# Patient Record
Sex: Female | Born: 1945 | Race: White | Hispanic: No | Marital: Married | Smoking: Never smoker
Health system: Southern US, Community
[De-identification: ages and names within clinical notes are randomized; demographics above are authoritative.]

## PROBLEM LIST (undated history)

## (undated) DIAGNOSIS — E039 Hypothyroidism, unspecified: Secondary | ICD-10-CM

## (undated) DIAGNOSIS — D649 Anemia, unspecified: Secondary | ICD-10-CM

## (undated) DIAGNOSIS — M1990 Unspecified osteoarthritis, unspecified site: Secondary | ICD-10-CM

## (undated) DIAGNOSIS — T7840XA Allergy, unspecified, initial encounter: Secondary | ICD-10-CM

## (undated) DIAGNOSIS — K219 Gastro-esophageal reflux disease without esophagitis: Secondary | ICD-10-CM

## (undated) HISTORY — PX: BREAST SURGERY: SHX581

## (undated) HISTORY — PX: APPENDECTOMY: SHX54

## (undated) HISTORY — PX: OTHER SURGICAL HISTORY: SHX169

## (undated) HISTORY — PX: ABDOMINAL HYSTERECTOMY: SHX81

---

## 2001-04-03 ENCOUNTER — Other Ambulatory Visit: Admission: RE | Admit: 2001-04-03 | Discharge: 2001-04-03 | Payer: Self-pay | Admitting: Internal Medicine

## 2001-04-09 ENCOUNTER — Encounter: Payer: Self-pay | Admitting: Internal Medicine

## 2001-04-09 ENCOUNTER — Ambulatory Visit (HOSPITAL_COMMUNITY): Admission: RE | Admit: 2001-04-09 | Discharge: 2001-04-09 | Payer: Self-pay | Admitting: Internal Medicine

## 2003-07-14 ENCOUNTER — Ambulatory Visit (HOSPITAL_COMMUNITY): Admission: RE | Admit: 2003-07-14 | Discharge: 2003-07-14 | Payer: Self-pay | Admitting: Family Medicine

## 2003-07-14 ENCOUNTER — Encounter: Payer: Self-pay | Admitting: Family Medicine

## 2003-08-06 ENCOUNTER — Encounter: Payer: Self-pay | Admitting: *Deleted

## 2003-08-06 ENCOUNTER — Ambulatory Visit (HOSPITAL_COMMUNITY): Admission: RE | Admit: 2003-08-06 | Discharge: 2003-08-06 | Payer: Self-pay | Admitting: *Deleted

## 2009-05-18 ENCOUNTER — Ambulatory Visit (HOSPITAL_COMMUNITY): Admission: RE | Admit: 2009-05-18 | Discharge: 2009-05-18 | Payer: Self-pay | Admitting: Family Medicine

## 2010-05-10 ENCOUNTER — Ambulatory Visit (HOSPITAL_COMMUNITY)
Admission: RE | Admit: 2010-05-10 | Discharge: 2010-05-10 | Payer: Self-pay | Source: Home / Self Care | Admitting: Family Medicine

## 2010-09-25 ENCOUNTER — Ambulatory Visit (HOSPITAL_COMMUNITY)
Admission: RE | Admit: 2010-09-25 | Discharge: 2010-09-25 | Payer: Self-pay | Source: Home / Self Care | Attending: General Surgery | Admitting: General Surgery

## 2010-11-05 ENCOUNTER — Encounter: Payer: Self-pay | Admitting: Family Medicine

## 2010-12-26 LAB — SURGICAL PCR SCREEN: Staphylococcus aureus: NEGATIVE

## 2010-12-26 LAB — CBC
HCT: 41.8 % (ref 36.0–46.0)
Hemoglobin: 14.4 g/dL (ref 12.0–15.0)
MCHC: 34.4 g/dL (ref 30.0–36.0)
RDW: 13.1 % (ref 11.5–15.5)

## 2010-12-26 LAB — BASIC METABOLIC PANEL
GFR calc Af Amer: >60 mL/min (ref >60)
GFR calc non Af Amer: >60 mL/min (ref >60)
Potassium: 4.2 mEq/L (ref 3.5–5.1)
Sodium: 138 mEq/L (ref 135–145)

## 2011-03-02 NOTE — H&P (Signed)
NAME:  Danielle, Ritter                    ACCOUNT NO.:   MEDICAL RECORD NO.:  [id number]        PATIENT TYPE:  PAMB   LOCATION:  DAY                           FACILITY:  [facility]   PHYSICIAN:  Dalia Heading, M.D.       DATE OF BIRTH:   DATE OF ADMISSION:  DATE OF DISCHARGE:  LH                              HISTORY & PHYSICAL    CHIEF COMPLAINT:  Right nipple discharge.   HISTORY OF PRESENT ILLNESS:  The patient is a 65 year old white female  who is referred for evaluation and treatment of right nipple discharge.  It has been present for many months since this summer.  Mammograms have  been negative.  She had a failed attempt at galactography at the breast  center and at [facility].  Cytology reportedly was negative.  She was given  the option for surgery or 82-month followup.  The discharge has always  been clear.  She had a benign mass taken on her left breast at the age  of 73.   PAST MEDICAL HISTORY:  Reflux disease.   PAST SURGICAL HISTORY:  As noted above.   CURRENT MEDICATIONS:  Omeprazole, vitamin E, calcium supplements, B12  supplements, and glucosamine.   ALLERGIES:  CODEINE.   REVIEW OF SYSTEMS:  The patient denies drinking or smoking.  She denies  any other cardiopulmonary difficulties or bleeding disorders.   FAMILY MEDICAL HISTORY:  There is no family history of breast cancer.   PHYSICAL EXAMINATION:  GENERAL:  The patient is a well-developed, well-  nourished white female in no acute distress.  HEENT:  Unremarkable.  LUNGS:  Clear to auscultation with equal breath sounds bilaterally.  HEART:  Regular rate and rhythm without S3, S4, or murmurs.  BREAST:  Right breast examination reveals no dominant mass or dimpling.  There is a clear nipple discharge at the 3 o'clock position.  No  axillary lymphadenopathy is noted.  Left breast examination reveals no  dominant mass, nipple discharge, or dimpling.  The axilla is negative  for palpable nodes.   IMPRESSION:  Right  nipple discharge.   PLAN:  The patient is scheduled for right breast biopsy on September 27, 2010.  Risks and benefits of the procedure including bleeding,  infection, and the possibility of malignancy were fully explained to the  patient, gave informed consent.      Dalia Heading, M.D.      MAJ/MEDQ  D:  09/19/2010  T:  09/20/2010  Job:  [id number]   cc:   Corrie Mckusick, M.D.  Fax: [phone]   Short Stay  [facility] Hawking

## 2011-03-02 NOTE — Procedures (Signed)
   NAME:  RADA, ZEGERS NO.:  [id number]   MEDICAL RECORD NO.:  [id number]                   PATIENT TYPE:  OUT   LOCATION:  RAD                                  FACILITY:  [facility]   PHYSICIAN:  Vida Roller, M.D.                DATE OF BIRTH:  [birth year]   DATE OF PROCEDURE:  DATE OF DISCHARGE:                                    STRESS TEST   INDICATIONS:  Ms. Costilow is a 65 year old female with no known coronary  artery disease with atypical chest discomfort.  Her cardiac risk factors  include hyperlipidemia and positive family history.   BASELINE DATA:  EKG reveals sinus rhythm at 73 beats per minute with  incomplete right bundle branch block and nondiagnostic inferior Q-waves  noted.  Blood pressure is 128/72.   Patient exercised for a total of six minutes to Bruce protocol stage II and  7 mets.  Maximum heart rate achieved was 155 beats per minute, which is 96%  of predicted maximum.  Maximum blood pressure is 188/82, which resolved down  to 130/72 in recovery.  The patient complained of brief episode of chest  pain in stage I of exercise that lasted only one to two seconds.  She  reports this is consistent with the chest pain she has been having.  EKG  revealed a few PVCs and no ischemic changes.  Test was stopped secondary to  fatigue.  Cardiolite was injected at three minutes and 41 seconds into  exercise.   Final images and results are pending M.D. review.     ________________________________________  ___________________________________________  Jae Dire, P.A. [facility]                      Vida Roller, M.D.   AB/MEDQ  D:  08/06/2003  T:  08/06/2003  Job:  [id number]

## 2013-01-19 ENCOUNTER — Other Ambulatory Visit (HOSPITAL_COMMUNITY): Payer: Self-pay | Admitting: Family Medicine

## 2013-01-19 DIAGNOSIS — Z13820 Encounter for screening for osteoporosis: Secondary | ICD-10-CM

## 2013-01-20 ENCOUNTER — Ambulatory Visit (HOSPITAL_COMMUNITY)
Admission: RE | Admit: 2013-01-20 | Discharge: 2013-01-20 | Disposition: A | Discharge status: Home / Self Care | Payer: Medicare Other | Source: Ambulatory Visit | Attending: Family Medicine | Admitting: Family Medicine

## 2013-01-20 DIAGNOSIS — M899 Disorder of bone, unspecified: Secondary | ICD-10-CM | POA: Insufficient documentation

## 2013-01-20 DIAGNOSIS — Z13820 Encounter for screening for osteoporosis: Secondary | ICD-10-CM

## 2015-08-17 DIAGNOSIS — Z1389 Encounter for screening for other disorder: Secondary | ICD-10-CM | POA: Diagnosis not present

## 2015-08-17 DIAGNOSIS — Z6830 Body mass index (BMI) 30.0-30.9, adult: Secondary | ICD-10-CM | POA: Diagnosis not present

## 2015-08-17 DIAGNOSIS — Z0000 Encounter for general adult medical examination without abnormal findings: Secondary | ICD-10-CM | POA: Diagnosis not present

## 2015-08-17 DIAGNOSIS — R0602 Shortness of breath: Secondary | ICD-10-CM | POA: Diagnosis not present

## 2015-08-17 DIAGNOSIS — R7301 Impaired fasting glucose: Secondary | ICD-10-CM | POA: Diagnosis not present

## 2015-08-17 DIAGNOSIS — E6609 Other obesity due to excess calories: Secondary | ICD-10-CM | POA: Diagnosis not present

## 2015-12-20 ENCOUNTER — Ambulatory Visit: Payer: Self-pay

## 2016-04-27 ENCOUNTER — Other Ambulatory Visit (HOSPITAL_COMMUNITY): Payer: Self-pay | Admitting: Internal Medicine

## 2016-04-27 DIAGNOSIS — R109 Unspecified abdominal pain: Secondary | ICD-10-CM | POA: Diagnosis not present

## 2016-04-27 DIAGNOSIS — K219 Gastro-esophageal reflux disease without esophagitis: Secondary | ICD-10-CM | POA: Diagnosis not present

## 2016-04-27 DIAGNOSIS — Z1389 Encounter for screening for other disorder: Secondary | ICD-10-CM | POA: Diagnosis not present

## 2016-05-03 ENCOUNTER — Ambulatory Visit (HOSPITAL_COMMUNITY)
Admission: RE | Admit: 2016-05-03 | Discharge: 2016-05-03 | Disposition: A | Discharge status: Home / Self Care | Payer: Medicare Other | Source: Ambulatory Visit | Attending: Internal Medicine | Admitting: Internal Medicine

## 2016-05-03 ENCOUNTER — Other Ambulatory Visit (HOSPITAL_COMMUNITY): Payer: Self-pay | Admitting: Family Medicine

## 2016-05-03 DIAGNOSIS — R109 Unspecified abdominal pain: Secondary | ICD-10-CM | POA: Diagnosis not present

## 2016-05-17 ENCOUNTER — Ambulatory Visit (INDEPENDENT_AMBULATORY_CARE_PROVIDER_SITE_OTHER): Payer: Medicare Other | Admitting: Cardiovascular Disease

## 2016-05-17 ENCOUNTER — Encounter: Payer: Self-pay | Admitting: Cardiovascular Disease

## 2016-05-17 VITALS — BP 136/74 | HR 86 | Ht 61.0 in | Wt 150.0 lb

## 2016-05-17 DIAGNOSIS — R0602 Shortness of breath: Secondary | ICD-10-CM

## 2016-05-17 DIAGNOSIS — R079 Chest pain, unspecified: Secondary | ICD-10-CM | POA: Diagnosis not present

## 2016-05-17 DIAGNOSIS — Z136 Encounter for screening for cardiovascular disorders: Secondary | ICD-10-CM

## 2016-05-17 NOTE — Patient Instructions (Signed)
Medication Instructions:  Your physician recommends that you continue on your current medications as directed. Please refer to the Current Medication list given to you today.   Labwork: NONE  Testing/Procedures: Your physician has requested that you have an echocardiogram. Echocardiography is a painless test that uses sound waves to create images of your heart. It provides your doctor with information about the size and shape of your heart and how well your heart's chambers and valves are working. This procedure takes approximately one hour. There are no restrictions for this procedure.   Your physician has requested that you have an echocardiogram. Echocardiography is a painless test that uses sound waves to create images of your heart. It provides your doctor with information about the size and shape of your heart and how well your heart's chambers and valves are working. This procedure takes approximately one hour. There are no restrictions for this procedure.   Your physician has requested that you have a lexiscan myoview. For further information please visit [email or web address]. Please follow instruction sheet, as given.    Follow-Up: Your physician recommends that you schedule a follow-up appointment in: 1 MONTH    Any Other Special Instructions Will Be Listed Below (If Applicable).     If you need a refill on your cardiac medications before your next appointment, please call your pharmacy.

## 2016-05-17 NOTE — Progress Notes (Addendum)
CARDIOLOGY CONSULT NOTE  Patient ID: Danielle Ritter MRN: [id number] DOB/AGE: 1946-08-23 70 y.o.  Admit date: (Not on file) Primary Physician: Purvis Kilts, MD Referring Physician:   Reason for Consultation: chest pain  HPI: The patient is a 70 year old woman referred for the evaluation of chest pain. She has a history of GERD. She takes fish oil. A recent abdominal ultrasound showed a contracted gallbladder filled with gallstones. She says she has a retrosternal "dull ache" which is not necessarily exacerbated with exertion. She does have exertional dyspnea for which she takes Lasix which alleviates her symptoms. She also has bilateral swelling of the ankles and feet for the past 37 years. She denies orthopnea and paroxysmal nocturnal dyspnea. She has worn compression stockings throughout the year for several years.  She has been awoken by palpitations at night but this was some time back.  Family history: Sister died of congestive heart failure at age 7, another sister had CABG at age 68, brother had CABG at age 65.   Allergies  Allergen Reactions  . Omeprazole     Current Outpatient Prescriptions  Medication Sig Dispense Refill  . calcium carbonate (TUMS - DOSED IN MG ELEMENTAL CALCIUM) 500 MG chewable tablet Chew 1 tablet by mouth daily.    . furosemide (LASIX) 20 MG tablet Take 20 mg by mouth daily as needed.    Javier Docker Oil 300 MG CAPS Take 300 mg by mouth daily.    Marland Kitchen loratadine (CLARITIN) 10 MG tablet Take 10 mg by mouth daily.    Marland Kitchen omeprazole (PRILOSEC) 10 MG capsule Take 10 mg by mouth daily.    . vitamin E (VITAMIN E) 200 UNIT capsule Take 200 Units by mouth daily.     No current facility-administered medications for this visit.     History reviewed. No pertinent past medical history.  Past Surgical History:  Procedure Laterality Date  . APPENDECTOMY    . bladder tact    . hystorectomy      Social History   Social History  . Marital status:  Married    Spouse name: N/A  . Number of children: N/A  . Years of education: N/A   Occupational History  . Not on file.   Social History Main Topics  . Smoking status: Never Smoker  . Smokeless tobacco: Never Used  . Alcohol use No  . Drug use: No  . Sexual activity: Not Currently   Other Topics Concern  . Not on file   Social History Narrative  . No narrative on file      Prior to Admission medications   Not on File     Review of systems complete and found to be negative unless listed above in HPI     Physical exam Blood pressure 136/74, pulse 86, height 5\' 1"  (1.549 m), weight 150 lb (68 kg), SpO2 95 %. General: NAD Neck: No JVD, no thyromegaly or thyroid nodule.  Lungs: Clear to auscultation bilaterally with normal respiratory effort. CV: Nondisplaced PMI. Regular rate and rhythm, normal S1/S2, no S3/S4, no murmur.  No peripheral edema.  No carotid bruit.  Normal pedal pulses.  Abdomen: Soft, nontender, no distention.  Skin: Intact without lesions or rashes.  Neurologic: Alert and oriented x 3.  Psych: Normal affect. Extremities: No clubbing or cyanosis.  HEENT: Normal.   ECG: Most recent ECG reviewed.  Labs:   Lab Results  Component Value Date   WBC 6.5 09/22/2010  HGB 14.4 09/22/2010   HCT 41.8 09/22/2010   MCV 90.5 09/22/2010   PLT 198 09/22/2010   No results for input(s): NA, K, CL, CO2, BUN, CREATININE, CALCIUM, PROT, BILITOT, ALKPHOS, ALT, AST, GLUCOSE in the last 168 hours.  Invalid input(s): LABALBU No results found for: CKTOTAL, CKMB, CKMBINDEX, TROPONINI No results found for: CHOL No results found for: HDL No results found for: LDLCALC No results found for: TRIG No results found for: CHOLHDL No results found for: LDLDIRECT       Studies: No results found.  ASSESSMENT AND PLAN:  1. Chest pain and exertional dyspnea: Typical and atypical features of CAD. Has a strong family h/o premature CAD. I will obtain an ECG today.  I will  proceed with a nuclear myocardial perfusion imaging study to evaluate for ischemic heart disease. I will order a 2-D echocardiogram with Doppler to evaluate cardiac structure, function, and regional wall motion.   Dispo: fu 6 weeks  Signed: Kate Sable, M.D., F.A.C.C.  05/17/2016, 3:41 PM   ECG performed in the office today which I personally reviewed demonstrates normal sinus rhythm with no ischemic ST segment or T-wave abnormalities, nor any arrhythmias.

## 2016-05-22 ENCOUNTER — Encounter (INDEPENDENT_AMBULATORY_CARE_PROVIDER_SITE_OTHER): Payer: Self-pay | Admitting: Internal Medicine

## 2016-05-29 ENCOUNTER — Ambulatory Visit (HOSPITAL_COMMUNITY): Payer: Medicare Other

## 2016-05-29 ENCOUNTER — Encounter (HOSPITAL_COMMUNITY)
Admission: RE | Admit: 2016-05-29 | Discharge: 2016-05-29 | Disposition: A | Discharge status: Home / Self Care | Payer: Medicare Other | Source: Ambulatory Visit | Attending: Cardiovascular Disease | Admitting: Cardiovascular Disease

## 2016-05-29 ENCOUNTER — Inpatient Hospital Stay (HOSPITAL_COMMUNITY): Admission: RE | Admit: 2016-05-29 | Payer: Medicare Other | Source: Ambulatory Visit

## 2016-05-29 ENCOUNTER — Encounter (HOSPITAL_COMMUNITY): Payer: Self-pay

## 2016-05-29 DIAGNOSIS — R079 Chest pain, unspecified: Secondary | ICD-10-CM | POA: Diagnosis not present

## 2016-05-29 DIAGNOSIS — R0602 Shortness of breath: Secondary | ICD-10-CM | POA: Diagnosis not present

## 2016-05-29 LAB — NM MYOCAR MULTI W/SPECT W/WALL MOTION / EF
CHL CUP NUCLEAR SDS: 0
CHL CUP NUCLEAR SRS: 0
CHL CUP RESTING HR STRESS: 72 {beats}/min
LHR: 0.26
LV dias vol: 55 mL (ref 46–106)
LV sys vol: 23 mL
NUC STRESS TID: 1.01
Peak HR: 110 {beats}/min
SSS: 0

## 2016-05-29 MED ORDER — SODIUM CHLORIDE 0.9% FLUSH
INTRAVENOUS | Status: AC
  Administered 2016-05-29: 10 mL via INTRAVENOUS
  Filled 2016-05-29: qty 10

## 2016-05-29 MED ORDER — TECHNETIUM TC 99M TETROFOSMIN IV KIT
30.0000 | PACK | Freq: Once | INTRAVENOUS | Status: AC | PRN
  Administered 2016-05-29: 31 via INTRAVENOUS

## 2016-05-29 MED ORDER — REGADENOSON 0.4 MG/5ML IV SOLN
INTRAVENOUS | Status: AC
  Administered 2016-05-29: 0.4 mg via INTRAVENOUS
  Filled 2016-05-29: qty 5

## 2016-05-29 MED ORDER — TECHNETIUM TC 99M TETROFOSMIN IV KIT
10.0000 | PACK | Freq: Once | INTRAVENOUS | Status: AC | PRN
  Administered 2016-05-29: 10.7 via INTRAVENOUS

## 2016-06-05 ENCOUNTER — Ambulatory Visit (INDEPENDENT_AMBULATORY_CARE_PROVIDER_SITE_OTHER): Payer: Medicare Other | Admitting: Internal Medicine

## 2016-06-05 DIAGNOSIS — K8020 Calculus of gallbladder without cholecystitis without obstruction: Secondary | ICD-10-CM | POA: Diagnosis not present

## 2016-06-07 ENCOUNTER — Encounter (INDEPENDENT_AMBULATORY_CARE_PROVIDER_SITE_OTHER): Payer: Self-pay | Admitting: Internal Medicine

## 2016-06-07 ENCOUNTER — Other Ambulatory Visit (INDEPENDENT_AMBULATORY_CARE_PROVIDER_SITE_OTHER): Payer: Self-pay | Admitting: Internal Medicine

## 2016-06-07 ENCOUNTER — Encounter (INDEPENDENT_AMBULATORY_CARE_PROVIDER_SITE_OTHER): Payer: Self-pay | Admitting: *Deleted

## 2016-06-07 ENCOUNTER — Ambulatory Visit (INDEPENDENT_AMBULATORY_CARE_PROVIDER_SITE_OTHER): Payer: Medicare Other | Admitting: Internal Medicine

## 2016-06-07 VITALS — BP 124/70 | HR 64 | Temp 98.6°F | Ht 61.0 in | Wt 152.3 lb

## 2016-06-07 DIAGNOSIS — K8020 Calculus of gallbladder without cholecystitis without obstruction: Secondary | ICD-10-CM | POA: Insufficient documentation

## 2016-06-07 DIAGNOSIS — E7800 Pure hypercholesterolemia, unspecified: Secondary | ICD-10-CM | POA: Insufficient documentation

## 2016-06-07 DIAGNOSIS — K219 Gastro-esophageal reflux disease without esophagitis: Secondary | ICD-10-CM

## 2016-06-07 DIAGNOSIS — M25473 Effusion, unspecified ankle: Secondary | ICD-10-CM | POA: Insufficient documentation

## 2016-06-07 NOTE — Progress Notes (Signed)
   Subjective:    Patient ID: Danielle Ritter, female    DOB: 17-Jul-1946, 70 y.o.   MRN: [id number]  HPI Referred by Dr. Hilma Favors for GERD. She presents today with c/o pain rt side of her upper  back.  Sometimes she has pain under her rt ribs. She takes Omeprazole OTC for acid reflux. She takes one in am.  If she skips a dose she will have acid reflux. She has had acid reflux for years. She has been on Omeprazole for years.  She has been evaluated by Dr. Arnoldo Morale and he wants her to have an EGD before he proceeds with a cholecystectomy.   05/03/2016 US abdomen:  CLINICAL DATA:  Two days of right upper back and abdominal pain. History of previous abdominal hysterectomy and appendectomy  Other findings: No ascites.  IMPRESSION: 1. Probably contracted gallbladder filled with stones. This could be confirmed with abdominal CT scanning. 2. Limited visualization of the aorta, inferior vena cava, and pancreas due to bowel gas. Otherwise no abnormality outside of the gallbladder is observed.   Last colonoscopy in 2010: few small diverticula at sigmoid colon. Otherwise normal exam.    Review of Systems No past medical history on file.  Past Surgical History:  Procedure Laterality Date  . APPENDECTOMY    . bladder tact    . hystorectomy      No Known Allergies  Current Outpatient Prescriptions on File Prior to Visit  Medication Sig Dispense Refill  . calcium carbonate (TUMS - DOSED IN MG ELEMENTAL CALCIUM) 500 MG chewable tablet Chew 1 tablet by mouth daily.    . furosemide (LASIX) 20 MG tablet Take 20 mg by mouth daily as needed.    Javier Docker Oil 300 MG CAPS Take 300 mg by mouth daily.    Marland Kitchen loratadine (CLARITIN) 10 MG tablet Take 10 mg by mouth daily.    Marland Kitchen omeprazole (PRILOSEC) 10 MG capsule Take 10 mg by mouth daily.    . vitamin E (VITAMIN E) 200 UNIT capsule Take 400 Units by mouth daily.      No current facility-administered medications on file prior to visit.        Objective:    Physical Exam Blood pressure 124/70, pulse 64, temperature 98.6 F (37 C), height 5\' 1"  (1.549 m), weight 152 lb 4.8 oz (69.1 kg).  Alert and oriented. Skin warm and dry. Oral mucosa is moist.   . Sclera anicteric, conjunctivae is pink. Thyroid not enlarged. No cervical lymphadenopathy. Lungs clear. Heart regular rate and rhythm.  Abdomen is soft. Bowel sounds are positive. No hepatomegaly. No abdominal masses felt. No tenderness.  No edema to lower extremities.  .      Assessment & Plan:  GERD. Has been on Omeprazole for years. PUD needs to be ruled out. The risks and benefits such as perforation, bleeding, and infection were reviewed with the patient and is agreeable.

## 2016-06-07 NOTE — Patient Instructions (Signed)
The risks and benefits such as perforation, bleeding, and infection were reviewed with the patient and is agreeable. 

## 2016-06-08 NOTE — H&P (Signed)
  NTS SOAP Note  Vital Signs:  Vitals as of: XX123456: Systolic XX123456: Diastolic 84: Heart Rate 85: Temp 97.71F (Temporal): Height 71ft 1in: Weight 152Lbs 0 Ounces: BMI 28.72   BMI : 28.72 kg/m2  Subjective: This 70 year old female presents for of right upper quadrant abdominal pain.  Has been occurring sporadically for many months now.  Made worse with fatty foods, though tries to avoid them.  Develops pain in right upper quadrant, radiating to the right flank and shoulder.  Makes reflux worse.  +bloating.  No nausea, vomiting.  No fever, chills, jaundice.  Pain resolves spontaneously.  Currently feels ok.  Review of Symptoms:  Constitutional:negative Head:negative Eyes:negative sinus problems Cardiovascular:negative Respiratory:negative Gastrointestinheartburn Genitourinary:negative joint and back pain Skin:negative Hematolgic/Lymphatic:negative Allergic/Immunologic:negative   Past Medical History:Reviewed  Past Medical History  Surgical History: breast biopsy, [facility] Medical Problems: reflux Allergies: nkda Medications: prilosec, loratadine, krill oil   Social History:Reviewed  Social History  Preferred Language: English Race:  White Ethnicity: Not Hispanic / Latino Age: 31 year Marital Status:  M Alcohol: no   Smoking Status: Never smoker reviewed on 06/05/2016 Functional Status reviewed on 06/05/2016 ------------------------------------------------ Bathing: Normal Cooking: Normal Dressing: Normal Driving: Normal Eating: Normal Managing Meds: Normal Oral Care: Normal Shopping: Normal Toileting: Normal Transferring: Normal Walking: Normal Cognitive Status reviewed on 06/05/2016 ------------------------------------------------ Attention: Normal Decision Making: Normal Language: Normal Memory: Normal Motor: Normal Perception: Normal Problem Solving: Normal Visual and Spatial: Normal   Family History:Reviewed  Family Health  History Mother, Deceased; Heart failure;  Father, Deceased; Hodgkin's lymphoma;     Objective Information: General:Well appearing, well nourished in no distress. Skin:no rash or prominent lesions Head:Atraumatic; no masses; no abnormalities Neck:Supple without lymphadenopathy.  Heart:RRR, no murmur or gallop.  Normal S1, S2.  No S3, S4.  Lungs:CTA bilaterally, no wheezes, rhonchi, rales.  Breathing unlabored. Abdomen:Soft, NT/ND, normal bowel sounds, no HSM, no masses.  No peritoneal signs. U/S report reviewed.  Cholelithiasis, normal common bile duct Assessment:Cholelithiasis  Diagnoses: 574.20  K80.20 Cholelithiasis without obstruction (Calculus of gallbladder without cholecystitis without obstruction)  Procedures: [id number] - OFFICE OUTPATIENT NEW 30 MINUTES    Plan:  Patient will see Dr. Laural Golden later today for possible EGD.  Will call to schedule laparoscopic cholecystectomy.   Patient Education:Alternative treatments to surgery were discussed with patient (and family).Risks and benefits  of procedure including bleeding, infection, hepatobiliary injury, and the possibility of an open procedure were fully explained to the patient (and family) who gave informed consent. Patient/family questions were addressed.  Follow-up:Pending Surgery

## 2016-06-14 NOTE — Patient Instructions (Signed)
Danielle Ritter  06/14/2016     @PREFPERIOPPHARMACY @   Your procedure is scheduled on  06/20/2016  Report to [facility] at  70  A.M.  Call this number if you have problems the morning of surgery:  [phone]   Remember:  Do not eat food or drink liquids after midnight.  Take these medicines the morning of surgery with A SIP OF WATER  Claritin, prilosec.   Do not wear jewelry, make-up or nail polish.  Do not wear lotions, powders, or perfumes, or deoderant.  Do not shave 48 hours prior to surgery.  Men may shave face and neck.  Do not bring valuables to the hospital.  [facility] is not responsible for any belongings or valuables.  Contacts, dentures or bridgework may not be worn into surgery.  Leave your suitcase in the car.  After surgery it may be brought to your room.  For patients admitted to the hospital, discharge time will be determined by your treatment team.  Patients discharged the day of surgery will not be allowed to drive home.   Name and phone number of your driver:   family Special instructions:  none  Please read over the following fact sheets that you were given. Anesthesia Post-op Instructions and Care and Recovery After Surgery       Laparoscopic Cholecystectomy Laparoscopic cholecystectomy is surgery to remove the gallbladder. The gallbladder is located in the upper right part of the abdomen, behind the liver. It is a storage sac for bile, which is produced in the liver. Bile aids in the digestion and absorption of fats. Cholecystectomy is often done for inflammation of the gallbladder (cholecystitis). This condition is usually caused by a buildup of gallstones (cholelithiasis) in the gallbladder. Gallstones can block the flow of bile, and that can result in inflammation and pain. In severe cases, emergency surgery may be required. If emergency surgery is not required, you will have time to prepare for the procedure. Laparoscopic  surgery is an alternative to open surgery. Laparoscopic surgery has a shorter recovery time. Your common bile duct may also need to be examined during the procedure. If stones are found in the common bile duct, they may be removed. LET [facility] CARE PROVIDER KNOW ABOUT:  Any allergies you have.  All medicines you are taking, including vitamins, herbs, eye drops, creams, and over-the-counter medicines.  Previous problems you or members of your family have had with the use of anesthetics.  Any blood disorders you have.  Previous surgeries you have had.  Any medical conditions you have. RISKS AND COMPLICATIONS Generally, this is a safe procedure. However, problems may occur, including:  Infection.  Bleeding.  Allergic reactions to medicines.  Damage to other structures or organs.  A stone remaining in the common bile duct.  A bile leak from the cyst duct that is clipped when your gallbladder is removed.  The need to convert to open surgery, which requires a larger incision in the abdomen. This may be necessary if your surgeon thinks that it is not safe to continue with a laparoscopic procedure. BEFORE THE PROCEDURE  Ask your health care provider about:  Changing or stopping your regular medicines. This is especially important if you are taking diabetes medicines or blood thinners.  Taking medicines such as aspirin and ibuprofen. These medicines can thin your blood. Do not take these medicines before your procedure if your health care provider instructs  you not to.  Follow instructions from your health care provider about eating or drinking restrictions.  Let your health care provider know if you develop a cold or an infection before surgery.  Plan to have someone take you home after the procedure.  Ask your health care provider how your surgical site will be marked or identified.  You may be given antibiotic medicine to help prevent infection. PROCEDURE  To reduce  your risk of infection:  Your health care team will wash or sanitize their hands.  Your skin will be washed with soap.  An IV tube may be inserted into one of your veins.  You will be given a medicine to make you fall asleep (general anesthetic).  A breathing tube will be placed in your mouth.  The surgeon will make several small cuts (incisions) in your abdomen.  A thin, lighted tube (laparoscope) that has a tiny camera on the end will be inserted through one of the small incisions. The camera on the laparoscope will send a picture to a TV screen (monitor) in the operating room. This will give the surgeon a good view inside your abdomen.  A gas will be pumped into your abdomen. This will expand your abdomen to give the surgeon more room to perform the surgery.  Other tools that are needed for the procedure will be inserted through the other incisions. The gallbladder will be removed through one of the incisions.  After your gallbladder has been removed, the incisions will be closed with stitches (sutures), staples, or skin glue.  Your incisions may be covered with a bandage (dressing). The procedure may vary among health care providers and hospitals. AFTER THE PROCEDURE  Your blood pressure, heart rate, breathing rate, and blood oxygen level will be monitored often until the medicines you were given have worn off.  You will be given medicines as needed to control your pain.   This information is not intended to replace advice given to you by your health care provider. Make sure you discuss any questions you have with your health care provider.   Document Released: 10/01/2005 Document Revised: 06/22/2015 Document Reviewed: 05/13/2013 Elsevier Interactive Patient Education 2016 [employer]  Laparoscopic Cholecystectomy, Care After Refer to this sheet in the next few weeks. These instructions provide you with information about caring for yourself after your procedure. Your health  care provider may also give you more specific instructions. Your treatment has been planned according to current medical practices, but problems sometimes occur. Call your health care provider if you have any problems or questions after your procedure. WHAT TO EXPECT AFTER THE PROCEDURE After your procedure, it is common to have:  Pain at your incision sites. You will be given pain medicines to control your pain.  Mild nausea or vomiting. This should improve after the first 24 hours.  Bloating and possible shoulder pain from the gas that was used during the procedure. This will improve after the first 24 hours. HOME CARE INSTRUCTIONS Incision Care  Follow instructions from your health care provider about how to take care of your incisions. Make sure you:  Wash your hands with soap and water before you change your bandage (dressing). If soap and water are not available, use hand sanitizer.  Change your dressing as told by your health care provider.  Leave stitches (sutures), skin glue, or adhesive strips in place. These skin closures may need to be in place for 2 weeks or longer. If adhesive strip edges start to  loosen and curl up, you may trim the loose edges. Do not remove adhesive strips completely unless your health care provider tells you to do that.  Do not take baths, swim, or use a hot tub until your health care provider approves. Ask your health care provider if you can take showers. You may only be allowed to take sponge baths for bathing. General Instructions  Take over-the-counter and prescription medicines only as told by your health care provider.  Do not drive or operate heavy machinery while taking prescription pain medicine.  Return to your normal diet as told by your health care provider.  Do not lift anything that is heavier than 10 lb (4.5 kg).  Do not play contact sports for one week or until your health care provider approves. SEEK MEDICAL CARE IF:   You have  redness, swelling, or pain at the site of your incision.  You have fluid, blood, or pus coming from your incision.  You notice a bad smell coming from your incision area.  Your surgical incisions break open.  You have a fever. SEEK IMMEDIATE MEDICAL CARE IF:  You develop a rash.  You have difficulty breathing.  You have chest pain.  You have increasing pain in your shoulders (shoulder strap areas).  You faint or have dizzy episodes while you are standing.  You have severe pain in your abdomen.  You have nausea or vomiting that lasts for more than one day.   This information is not intended to replace advice given to you by your health care provider. Make sure you discuss any questions you have with your health care provider.   Document Released: 10/01/2005 Document Revised: 06/22/2015 Document Reviewed: 05/13/2013 Elsevier Interactive Patient Education 2016 [place] Anesthesia, Adult General anesthesia is a sleep-like state of non-feeling produced by medicines (anesthetics). General anesthesia prevents you from being alert and feeling pain during a medical procedure. Your caregiver may recommend general anesthesia if your procedure:  Is long.  Is painful or uncomfortable.  Would be frightening to see or hear.  Requires you to be still.  Affects your breathing.  Causes significant blood loss. LET YOUR CAREGIVER KNOW ABOUT:  Allergies to food or medicine.  Medicines taken, including vitamins, herbs, eyedrops, over-the-counter medicines, and creams.  Use of steroids (by mouth or creams).  Previous problems with anesthetics or numbing medicines, including problems experienced by relatives.  History of bleeding problems or blood clots.  Previous surgeries and types of anesthetics received.  Possibility of pregnancy, if this applies.  Use of cigarettes, alcohol, or illegal drugs.  Any health condition(s), especially diabetes, sleep apnea, and high  blood pressure. RISKS AND COMPLICATIONS General anesthesia rarely causes complications. However, if complications do occur, they can be life threatening. Complications include:  A lung infection.  A stroke.  A heart attack.  Waking up during the procedure. When this occurs, the patient may be unable to move and communicate that he or she is awake. The patient may feel severe pain. Older adults and adults with serious medical problems are more likely to have complications than adults who are young and healthy. Some complications can be prevented by answering all of your caregiver's questions thoroughly and by following all pre-procedure instructions. It is important to tell your caregiver if any of the pre-procedure instructions, especially those related to diet, were not followed. Any food or liquid in the stomach can cause problems when you are under general anesthesia. BEFORE THE PROCEDURE  Ask your caregiver  if you will have to spend the night at the hospital. If you will not have to spend the night, arrange to have an adult drive you and stay with you for 24 hours.  Follow your caregiver's instructions if you are taking dietary supplements or medicines. Your caregiver may tell you to stop taking them or to reduce your dosage.  Do not smoke for as long as possible before your procedure. If possible, stop smoking 3-6 weeks before the procedure.  Do not take new dietary supplements or medicines within 1 week of your procedure unless your caregiver approves them.  Do not eat within 8 hours of your procedure or as directed by your caregiver. Drink only clear liquids, such as water, black coffee (without milk or cream), and fruit juices (without pulp).  Do not drink within 3 hours of your procedure or as directed by your caregiver.  You may brush your teeth on the morning of the procedure, but make sure to spit out the toothpaste and water when finished. PROCEDURE  You will receive  anesthetics through a mask, through an intravenous (IV) access tube, or through both. A doctor who specializes in anesthesia (anesthesiologist) or a nurse who specializes in anesthesia (nurse anesthetist) or both will stay with you throughout the procedure to make sure you remain unconscious. He or she will also watch your blood pressure, pulse, and oxygen levels to make sure that the anesthetics do not cause any problems. Once you are asleep, a breathing tube or mask may be used to help you breathe. AFTER THE PROCEDURE You will wake up after the procedure is complete. You may be in the room where the procedure was performed or in a recovery area. You may have a sore throat if a breathing tube was used. You may also feel:  Dizzy.  Weak.  Drowsy.  Confused.  Nauseous.  Cold. These are all normal responses and can be expected to last for up to 24 hours after the procedure is complete. A caregiver will tell you when you are ready to go home. This will usually be when you are fully awake and in stable condition.   This information is not intended to replace advice given to you by your health care provider. Make sure you discuss any questions you have with your health care provider.   Document Released: 01/08/2008 Document Revised: 10/22/2014 Document Reviewed: 01/30/2012 Elsevier Interactive Patient Education 2016 [place] Anesthesia, Adult, Care After Refer to this sheet in the next few weeks. These instructions provide you with information on caring for yourself after your procedure. Your health care provider may also give you more specific instructions. Your treatment has been planned according to current medical practices, but problems sometimes occur. Call your health care provider if you have any problems or questions after your procedure. WHAT TO EXPECT AFTER THE PROCEDURE After the procedure, it is typical to experience:  Sleepiness.  Nausea and vomiting. HOME CARE  INSTRUCTIONS  For the first 24 hours after general anesthesia:  Have a responsible person with you.  Do not drive a car. If you are alone, do not take public transportation.  Do not drink alcohol.  Do not take medicine that has not been prescribed by your health care provider.  Do not sign important papers or make important decisions.  You may resume a normal diet and activities as directed by your health care provider.  Change bandages (dressings) as directed.  If you have questions or problems that seem  related to general anesthesia, call the hospital and ask for the anesthetist or anesthesiologist on call. SEEK MEDICAL CARE IF:  You have nausea and vomiting that continue the day after anesthesia.  You develop a rash. SEEK IMMEDIATE MEDICAL CARE IF:   You have difficulty breathing.  You have chest pain.  You have any allergic problems.   This information is not intended to replace advice given to you by your health care provider. Make sure you discuss any questions you have with your health care provider.   Document Released: 01/07/2001 Document Revised: 10/22/2014 Document Reviewed: 01/30/2012 Elsevier Interactive Patient Education 2016 Elsevier Inc[employer] PATIENT INSTRUCTIONS POST-ANESTHESIA  IMMEDIATELY FOLLOWING SURGERY:  Do not drive or operate machinery for the first twenty four hours after surgery.  Do not make any important decisions for twenty four hours after surgery or while taking narcotic pain medications or sedatives.  If you develop intractable nausea and vomiting or a severe headache please notify your doctor immediately.  FOLLOW-UP:  Please make an appointment with your surgeon as instructed. You do not need to follow up with anesthesia unless specifically instructed to do so.  WOUND CARE INSTRUCTIONS (if applicable):  Keep a dry clean dressing on the anesthesia/puncture wound site if there is drainage.  Once the wound has quit draining you may leave it open  to air.  Generally you should leave the bandage intact for twenty four hours unless there is drainage.  If the epidural site drains for more than 36-48 hours please call the anesthesia department.  QUESTIONS?:  Please feel free to call your physician or the hospital operator if you have any questions, and they will be happy to assist you.

## 2016-06-15 ENCOUNTER — Encounter (HOSPITAL_COMMUNITY): Payer: Self-pay

## 2016-06-15 ENCOUNTER — Encounter (HOSPITAL_COMMUNITY)
Admission: RE | Admit: 2016-06-15 | Discharge: 2016-06-15 | Disposition: A | Discharge status: Home / Self Care | Payer: Medicare Other | Source: Ambulatory Visit | Attending: General Surgery | Admitting: General Surgery

## 2016-06-15 DIAGNOSIS — Z01812 Encounter for preprocedural laboratory examination: Secondary | ICD-10-CM | POA: Insufficient documentation

## 2016-06-15 HISTORY — DX: Gastro-esophageal reflux disease without esophagitis: K21.9

## 2016-06-15 HISTORY — DX: Unspecified osteoarthritis, unspecified site: M19.90

## 2016-06-15 LAB — CBC WITH DIFFERENTIAL/PLATELET
BASOS PCT: 1 %
Basophils Absolute: 0.1 10*3/uL (ref 0.0–0.1)
EOS ABS: 0.2 10*3/uL (ref 0.0–0.7)
EOS PCT: 3 %
HCT: 41.9 % (ref 36.0–46.0)
HEMOGLOBIN: 14.1 g/dL (ref 12.0–15.0)
Lymphocytes Relative: 30 %
Lymphs Abs: 1.9 10*3/uL (ref 0.7–4.0)
MCH: 30.5 pg (ref 26.0–34.0)
MCHC: 33.7 g/dL (ref 30.0–36.0)
MCV: 90.5 fL (ref 78.0–100.0)
Monocytes Absolute: 0.7 10*3/uL (ref 0.1–1.0)
Monocytes Relative: 11 %
NEUTROS PCT: 55 %
Neutro Abs: 3.5 10*3/uL (ref 1.7–7.7)
PLATELETS: 187 10*3/uL (ref 150–400)
RBC: 4.63 MIL/uL (ref 3.87–5.11)
RDW: 13.5 % (ref 11.5–15.5)
WBC: 6.4 10*3/uL (ref 4.0–10.5)

## 2016-06-15 LAB — BASIC METABOLIC PANEL
ANION GAP: 8 (ref 5–15)
BUN: 14 mg/dL (ref 6–20)
CALCIUM: 9.3 mg/dL (ref 8.9–10.3)
CO2: 24 mmol/L (ref 22–32)
CREATININE: 0.68 mg/dL (ref 0.44–1.00)
Chloride: 106 mmol/L (ref 101–111)
GFR calc Af Amer: >60 mL/min (ref >60)
GFR calc non Af Amer: >60 mL/min (ref >60)
Glucose, Bld: 108 mg/dL — ABNORMAL HIGH (ref 65–99)
Potassium: 3.8 mmol/L (ref 3.5–5.1)
Sodium: 138 mmol/L (ref 135–145)

## 2016-06-15 LAB — HEPATIC FUNCTION PANEL
ALBUMIN: 3.8 g/dL (ref 3.5–5.0)
ALK PHOS: 55 U/L (ref 38–126)
ALT: 17 U/L (ref 14–54)
AST: 19 U/L (ref 15–41)
Bilirubin, Direct: <0.1 mg/dL — ABNORMAL LOW (ref 0.1–0.5)
TOTAL PROTEIN: 6.8 g/dL (ref 6.5–8.1)
Total Bilirubin: 0.6 mg/dL (ref 0.3–1.2)

## 2016-06-19 DIAGNOSIS — H2513 Age-related nuclear cataract, bilateral: Secondary | ICD-10-CM | POA: Diagnosis not present

## 2016-06-19 DIAGNOSIS — H538 Other visual disturbances: Secondary | ICD-10-CM | POA: Diagnosis not present

## 2016-06-20 ENCOUNTER — Encounter (HOSPITAL_COMMUNITY)
Admission: RE | Disposition: A | Discharge status: Home / Self Care | Payer: Self-pay | Source: Ambulatory Visit | Attending: General Surgery

## 2016-06-20 ENCOUNTER — Ambulatory Visit (HOSPITAL_COMMUNITY): Payer: Medicare Other | Admitting: Anesthesiology

## 2016-06-20 ENCOUNTER — Encounter (HOSPITAL_COMMUNITY): Payer: Self-pay | Admitting: *Deleted

## 2016-06-20 ENCOUNTER — Ambulatory Visit (HOSPITAL_COMMUNITY)
Admission: RE | Admit: 2016-06-20 | Discharge: 2016-06-20 | Disposition: A | Discharge status: Home / Self Care | Payer: Medicare Other | Source: Ambulatory Visit | Attending: General Surgery | Admitting: General Surgery

## 2016-06-20 DIAGNOSIS — K8010 Calculus of gallbladder with chronic cholecystitis without obstruction: Secondary | ICD-10-CM | POA: Diagnosis not present

## 2016-06-20 DIAGNOSIS — K219 Gastro-esophageal reflux disease without esophagitis: Secondary | ICD-10-CM | POA: Insufficient documentation

## 2016-06-20 DIAGNOSIS — R1011 Right upper quadrant pain: Secondary | ICD-10-CM | POA: Diagnosis present

## 2016-06-20 DIAGNOSIS — K8020 Calculus of gallbladder without cholecystitis without obstruction: Secondary | ICD-10-CM | POA: Diagnosis not present

## 2016-06-20 HISTORY — PX: CHOLECYSTECTOMY: SHX55

## 2016-06-20 SURGERY — LAPAROSCOPIC CHOLECYSTECTOMY
Anesthesia: General | Site: Abdomen

## 2016-06-20 MED ORDER — ROCURONIUM BROMIDE 50 MG/5ML IV SOLN
INTRAVENOUS | Status: AC
  Filled 2016-06-20: qty 1

## 2016-06-20 MED ORDER — ALBUTEROL SULFATE (2.5 MG/3ML) 0.083% IN NEBU
2.5000 mg | INHALATION_SOLUTION | Freq: Four times a day (QID) | RESPIRATORY_TRACT | Status: DC | PRN
  Administered 2016-06-20: 2.5 mg via RESPIRATORY_TRACT

## 2016-06-20 MED ORDER — POVIDONE-IODINE 10 % EX OINT
TOPICAL_OINTMENT | CUTANEOUS | Status: AC
  Filled 2016-06-20: qty 1

## 2016-06-20 MED ORDER — BUPIVACAINE HCL (PF) 0.5 % IJ SOLN
INTRAMUSCULAR | Status: DC | PRN
  Administered 2016-06-20: 10 mL

## 2016-06-20 MED ORDER — EPHEDRINE SULFATE 50 MG/ML IJ SOLN
INTRAMUSCULAR | Status: DC | PRN
  Administered 2016-06-20: 5 mg via INTRAVENOUS

## 2016-06-20 MED ORDER — HYDROMORPHONE HCL 1 MG/ML IJ SOLN: 0.2500 mg | INTRAMUSCULAR | Status: DC | PRN

## 2016-06-20 MED ORDER — SUCCINYLCHOLINE CHLORIDE 20 MG/ML IJ SOLN
INTRAMUSCULAR | Status: DC | PRN
  Administered 2016-06-20: 100 mg via INTRAVENOUS

## 2016-06-20 MED ORDER — SODIUM CHLORIDE 0.9 % IJ SOLN
INTRAMUSCULAR | Status: AC
  Filled 2016-06-20: qty 10

## 2016-06-20 MED ORDER — ROCURONIUM BROMIDE 100 MG/10ML IV SOLN
INTRAVENOUS | Status: DC | PRN
  Administered 2016-06-20: 15 mg via INTRAVENOUS
  Administered 2016-06-20: 5 mg via INTRAVENOUS

## 2016-06-20 MED ORDER — MIDAZOLAM HCL 2 MG/2ML IJ SOLN
1.0000 mg | INTRAMUSCULAR | Status: DC | PRN
  Administered 2016-06-20: 2 mg via INTRAVENOUS

## 2016-06-20 MED ORDER — CHLORHEXIDINE GLUCONATE CLOTH 2 % EX PADS: 6.0000 | MEDICATED_PAD | Freq: Once | CUTANEOUS | Status: DC

## 2016-06-20 MED ORDER — MIDAZOLAM HCL 2 MG/2ML IJ SOLN
INTRAMUSCULAR | Status: AC
  Filled 2016-06-20: qty 2

## 2016-06-20 MED ORDER — FENTANYL CITRATE (PF) 100 MCG/2ML IJ SOLN
INTRAMUSCULAR | Status: DC | PRN
  Administered 2016-06-20 (×2): 50 ug via INTRAVENOUS

## 2016-06-20 MED ORDER — BUPIVACAINE HCL (PF) 0.5 % IJ SOLN
INTRAMUSCULAR | Status: AC
  Filled 2016-06-20: qty 30

## 2016-06-20 MED ORDER — LIDOCAINE HCL (CARDIAC) 20 MG/ML IV SOLN
INTRAVENOUS | Status: DC | PRN
  Administered 2016-06-20: 50 mg via INTRAVENOUS

## 2016-06-20 MED ORDER — NEOSTIGMINE METHYLSULFATE 10 MG/10ML IV SOLN
INTRAVENOUS | Status: DC | PRN
  Administered 2016-06-20: 4 mg via INTRAVENOUS

## 2016-06-20 MED ORDER — PROPOFOL 10 MG/ML IV BOLUS
INTRAVENOUS | Status: AC
  Filled 2016-06-20: qty 20

## 2016-06-20 MED ORDER — LACTATED RINGERS IV SOLN
INTRAVENOUS | Status: DC
  Administered 2016-06-20: 08:00:00 via INTRAVENOUS
  Administered 2016-06-20: 1000 mL via INTRAVENOUS

## 2016-06-20 MED ORDER — ONDANSETRON HCL 4 MG/2ML IJ SOLN
INTRAMUSCULAR | Status: AC
  Filled 2016-06-20: qty 2

## 2016-06-20 MED ORDER — FENTANYL CITRATE (PF) 250 MCG/5ML IJ SOLN
INTRAMUSCULAR | Status: AC
  Filled 2016-06-20: qty 5

## 2016-06-20 MED ORDER — DEXAMETHASONE SODIUM PHOSPHATE 4 MG/ML IJ SOLN
4.0000 mg | Freq: Once | INTRAMUSCULAR | Status: AC
Start: 2016-06-20 — End: 2016-06-20
  Administered 2016-06-20: 4 mg via INTRAVENOUS

## 2016-06-20 MED ORDER — SODIUM CHLORIDE 0.9 % IR SOLN
Status: DC | PRN
  Administered 2016-06-20: 500 mL

## 2016-06-20 MED ORDER — ALBUTEROL SULFATE (2.5 MG/3ML) 0.083% IN NEBU
INHALATION_SOLUTION | RESPIRATORY_TRACT | Status: AC
  Filled 2016-06-20: qty 3

## 2016-06-20 MED ORDER — ONDANSETRON HCL 4 MG/2ML IJ SOLN
4.0000 mg | Freq: Once | INTRAMUSCULAR | Status: AC
Start: 2016-06-20 — End: 2016-06-20
  Administered 2016-06-20: 4 mg via INTRAVENOUS

## 2016-06-20 MED ORDER — SODIUM CHLORIDE 0.9 % IN NEBU
INHALATION_SOLUTION | RESPIRATORY_TRACT | Status: AC
  Filled 2016-06-20: qty 3

## 2016-06-20 MED ORDER — PROPOFOL 10 MG/ML IV BOLUS
INTRAVENOUS | Status: DC | PRN
  Administered 2016-06-20: 110 mg via INTRAVENOUS

## 2016-06-20 MED ORDER — EPHEDRINE SULFATE 50 MG/ML IJ SOLN
INTRAMUSCULAR | Status: AC
  Filled 2016-06-20: qty 1

## 2016-06-20 MED ORDER — SUCCINYLCHOLINE CHLORIDE 20 MG/ML IJ SOLN
INTRAMUSCULAR | Status: AC
  Filled 2016-06-20: qty 1

## 2016-06-20 MED ORDER — HYDROCODONE-ACETAMINOPHEN 5-325 MG PO TABS: 1.0000 | ORAL_TABLET | Freq: Four times a day (QID) | ORAL | 0 refills | Status: DC | PRN

## 2016-06-20 MED ORDER — DEXAMETHASONE SODIUM PHOSPHATE 4 MG/ML IJ SOLN
INTRAMUSCULAR | Status: AC
  Filled 2016-06-20: qty 1

## 2016-06-20 MED ORDER — HEMOSTATIC AGENTS (NO CHARGE) OPTIME
TOPICAL | Status: DC | PRN
  Administered 2016-06-20: 1 via TOPICAL

## 2016-06-20 MED ORDER — GLYCOPYRROLATE 0.2 MG/ML IJ SOLN
INTRAMUSCULAR | Status: DC | PRN
  Administered 2016-06-20: 0.6 mg via INTRAVENOUS

## 2016-06-20 MED ORDER — CIPROFLOXACIN IN D5W 400 MG/200ML IV SOLN
400.0000 mg | INTRAVENOUS | Status: AC
  Administered 2016-06-20: 400 mg via INTRAVENOUS
  Filled 2016-06-20: qty 200

## 2016-06-20 MED ORDER — SODIUM CHLORIDE 0.9 % IN NEBU
3.0000 mL | INHALATION_SOLUTION | Freq: Three times a day (TID) | RESPIRATORY_TRACT | Status: DC | PRN
  Administered 2016-06-20: 3 mL via RESPIRATORY_TRACT

## 2016-06-20 MED ORDER — LIDOCAINE HCL (PF) 1 % IJ SOLN
INTRAMUSCULAR | Status: AC
  Filled 2016-06-20: qty 5

## 2016-06-20 MED ORDER — POVIDONE-IODINE 10 % OINT PACKET
TOPICAL_OINTMENT | CUTANEOUS | Status: DC | PRN
  Administered 2016-06-20: 1 via TOPICAL

## 2016-06-20 SURGICAL SUPPLY — 43 items
APPLIER CLIP LAPSCP 10X32 DD (CLIP) ×3 IMPLANT
BAG HAMPER (MISCELLANEOUS) ×3 IMPLANT
BAG SPEC RTRVL LRG 6X4 10 (ENDOMECHANICALS) ×1
CHLORAPREP W/TINT 26ML (MISCELLANEOUS) ×3 IMPLANT
CLOTH BEACON ORANGE TIMEOUT ST (SAFETY) ×3 IMPLANT
COVER LIGHT HANDLE STERIS (MISCELLANEOUS) ×6 IMPLANT
DECANTER SPIKE VIAL GLASS SM (MISCELLANEOUS) ×3 IMPLANT
ELECT REM PT RETURN 9FT ADLT (ELECTROSURGICAL) ×3
ELECTRODE REM PT RTRN 9FT ADLT (ELECTROSURGICAL) ×1 IMPLANT
FILTER SMOKE EVAC LAPAROSHD (FILTER) ×3 IMPLANT
FORMALIN 10 PREFIL 120ML (MISCELLANEOUS) ×3 IMPLANT
GLOVE BIOGEL PI IND STRL 7.0 (GLOVE) ×1 IMPLANT
GLOVE BIOGEL PI INDICATOR 7.0 (GLOVE) ×2
GLOVE SURG SS PI 7.5 STRL IVOR (GLOVE) ×3 IMPLANT
GOWN STRL REUS W/ TWL XL LVL3 (GOWN DISPOSABLE) ×1 IMPLANT
GOWN STRL REUS W/TWL LRG LVL3 (GOWN DISPOSABLE) ×6 IMPLANT
GOWN STRL REUS W/TWL XL LVL3 (GOWN DISPOSABLE) ×3
HEMOSTAT SNOW SURGICEL 2X4 (HEMOSTASIS) ×3 IMPLANT
INST SET LAPROSCOPIC AP (KITS) ×3 IMPLANT
IV NS IRRIG 3000ML ARTHROMATIC (IV SOLUTION) IMPLANT
KIT ROOM TURNOVER APOR (KITS) ×3 IMPLANT
MANIFOLD NEPTUNE II (INSTRUMENTS) ×3 IMPLANT
NDL INSUFFLATION 14GA 120MM (NEEDLE) ×1 IMPLANT
NEEDLE INSUFFLATION 14GA 120MM (NEEDLE) ×3 IMPLANT
NS IRRIG 1000ML POUR BTL (IV SOLUTION) ×3 IMPLANT
PACK LAP CHOLE LZT030E (CUSTOM PROCEDURE TRAY) ×3 IMPLANT
PAD ARMBOARD 7.5X6 YLW CONV (MISCELLANEOUS) ×3 IMPLANT
POUCH SPECIMEN RETRIEVAL 10MM (ENDOMECHANICALS) ×3 IMPLANT
SET BASIN LINEN APH (SET/KITS/TRAYS/PACK) ×3 IMPLANT
SET TUBE IRRIG SUCTION NO TIP (IRRIGATION / IRRIGATOR) IMPLANT
SLEEVE ENDOPATH XCEL 5M (ENDOMECHANICALS) ×3 IMPLANT
SPONGE GAUZE 2X2 8PLY STER LF (GAUZE/BANDAGES/DRESSINGS) ×4
SPONGE GAUZE 2X2 8PLY STRL LF (GAUZE/BANDAGES/DRESSINGS) ×8 IMPLANT
STAPLER VISISTAT (STAPLE) ×3 IMPLANT
SUT VICRYL 0 UR6 27IN ABS (SUTURE) ×3 IMPLANT
TAPE CLOTH SURG 4X10 WHT LF (GAUZE/BANDAGES/DRESSINGS) ×2 IMPLANT
TROCAR ENDO BLADELESS 11MM (ENDOMECHANICALS) ×3 IMPLANT
TROCAR XCEL NON-BLD 5MMX100MML (ENDOMECHANICALS) ×3 IMPLANT
TROCAR XCEL UNIV SLVE 11M 100M (ENDOMECHANICALS) ×3 IMPLANT
TUBE CONNECTING 12'X1/4 (SUCTIONS) ×1
TUBE CONNECTING 12X1/4 (SUCTIONS) ×2 IMPLANT
TUBING INSUFFLATION (TUBING) ×3 IMPLANT
WARMER LAPAROSCOPE (MISCELLANEOUS) ×3 IMPLANT

## 2016-06-20 NOTE — Discharge Instructions (Signed)
Laparoscopic Cholecystectomy, Care After °Refer to this sheet in the next few weeks. These instructions provide you with information about caring for yourself after your procedure. Your health care provider may also give you more specific instructions. Your treatment has been planned according to current medical practices, but problems sometimes occur. Call your health care provider if you have any problems or questions after your procedure. °WHAT TO EXPECT AFTER THE PROCEDURE °After your procedure, it is common to have: °· Pain at your incision sites. You will be given pain medicines to control your pain. °· Mild nausea or vomiting. This should improve after the first 24 hours. °· Bloating and possible shoulder pain from the gas that was used during the procedure. This will improve after the first 24 hours. °HOME CARE INSTRUCTIONS °Incision Care °· Follow instructions from your health care provider about how to take care of your incisions. Make sure you: °¨ Wash your hands with soap and water before you change your bandage (dressing). If soap and water are not available, use hand sanitizer. °¨ Change your dressing as told by your health care provider. °¨ Leave stitches (sutures), skin glue, or adhesive strips in place. These skin closures may need to be in place for 2 weeks or longer. If adhesive strip edges start to loosen and curl up, you may trim the loose edges. Do not remove adhesive strips completely unless your health care provider tells you to do that. °· Do not take baths, swim, or use a hot tub until your health care provider approves. Ask your health care provider if you can take showers. You may only be allowed to take sponge baths for bathing. °General Instructions °· Take over-the-counter and prescription medicines only as told by your health care provider. °· Do not drive or operate heavy machinery while taking prescription pain medicine. °· Return to your normal diet as told by your health care  provider. °· Do not lift anything that is heavier than 10 lb (4.5 kg). °· Do not play contact sports for one week or until your health care provider approves. °SEEK MEDICAL CARE IF:  °· You have redness, swelling, or pain at the site of your incision. °· You have fluid, blood, or pus coming from your incision. °· You notice a bad smell coming from your incision area. °· Your surgical incisions break open. °· You have a fever. °SEEK IMMEDIATE MEDICAL CARE IF: °· You develop a rash. °· You have difficulty breathing. °· You have chest pain. °· You have increasing pain in your shoulders (shoulder strap areas). °· You faint or have dizzy episodes while you are standing. °· You have severe pain in your abdomen. °· You have nausea or vomiting that lasts for more than one day. °  °This information is not intended to replace advice given to you by your health care provider. Make sure you discuss any questions you have with your health care provider. °  °Document Released: 10/01/2005 Document Revised: 06/22/2015 Document Reviewed: 05/13/2013 °Elsevier Interactive Patient Education ©2016 Elsevier Inc. ° ° °PATIENT INSTRUCTIONS °POST-ANESTHESIA ° °IMMEDIATELY FOLLOWING SURGERY:  Do not drive or operate machinery for the first twenty four hours after surgery.  Do not make any important decisions for twenty four hours after surgery or while taking narcotic pain medications or sedatives.  If you develop intractable nausea and vomiting or a severe headache please notify your doctor immediately. ° °FOLLOW-UP:  Please make an appointment with your surgeon as instructed. You do not need to   follow up with anesthesia unless specifically instructed to do so. ° °WOUND CARE INSTRUCTIONS (if applicable):  Keep a dry clean dressing on the anesthesia/puncture wound site if there is drainage.  Once the wound has quit draining you may leave it open to air.  Generally you should leave the bandage intact for twenty four hours unless there is  drainage.  If the epidural site drains for more than 36-48 hours please call the anesthesia department. ° °QUESTIONS?:  Please feel free to call your physician or the hospital operator if you have any questions, and they will be happy to assist you.    ° ° ° °

## 2016-06-20 NOTE — Interval H&P Note (Signed)
History and Physical Interval Note:  06/20/2016 7:14 AM  Danielle Ritter  has presented today for surgery, with the diagnosis of cholelithiasis  The various methods of treatment have been discussed with the patient and family. After consideration of risks, benefits and other options for treatment, the patient has consented to  Procedure(s): LAPAROSCOPIC CHOLECYSTECTOMY (N/A) as a surgical intervention .  The patient's history has been reviewed, patient examined, no change in status, stable for surgery.  I have reviewed the patient's chart and labs.  Questions were answered to the patient's satisfaction.     Aviva Signs A

## 2016-06-20 NOTE — Op Note (Signed)
Patient:  Danielle Ritter  DOB:  03-13-1946  MRN:  [id number]   Preop Diagnosis:  Cholelithiasis  Postop Diagnosis:  Same  Procedure:  Laparoscopic cholecystectomy  Surgeon:  Aviva Signs, M.D.  Assistant: Tama High, M.D.  Anes:  Gen. endotracheal  Indications:  Patient is a 70 year old white female who presents with biliary colic secondary to cholelithiasis. The risks and benefits of the procedure including bleeding, infection, hepatobiliary injury, the possibility of an open procedure were fully explained to the patient, who gave informed consent.  Procedure note:  The patient was placed the supine position. After induction of general endotracheal anesthesia, the abdomen was prepped and draped using the usual sterile technique with DuraPrep. Surgical site confirmation was performed.  A supraumbilical incision was made down to the fascia. A Veress needle was introduced into the abdominal cavity and confirmation of placement was done using the saline drop test. The abdomen was then insufflated to 16 mmHg pressure. An 11 mm trocar was introduced into the abdominal cavity under direct visualization without difficulty. The patient was then placed in reverse Trendelenburg position and an additional 11 mm trocar was placed the epigastric region and 5 mm trochars were placed the right upper quadrant and right flank regions. Liver was inspected and noted to be higher under the chest wall with some adhesions noted to the chest wall. The gallbladder was retracted in a dynamic fashion in order to provide a critical view of the triangle of [place]. A dome down approach was used to expose the triangle. The cystic artery was first identified. Endoclips were placed proximally and distally on the cystic artery, and the cystic artery was divided. The cystic duct was fully identified. Its juncture to the infundibulum was fully identified. Endoclips were placed proximally distally on the cystic duct, and the  cystic duct was divided. The gallbladder fossa was inspected and no abnormal bleeding was noted. Surgicel was placed the gallbladder fossa. The gallbladder was removed from the abdominal cavity using an Endo Catch bag. All fluid and air were then evacuated from the abdominal cavity prior to removal of the trochars.  All wounds were irrigated with normal saline. All wounds were injected with 0.5% Sensorcaine. The supraumbilical fascia was reapproximated using an 0 Vicryl interrupted suture. All skin incisions were closed using staples. Betadine ointment and dry sterile dressings were applied.  All tape and needle counts were correct at the end of the procedure. Patient was extubated in the operating room and transferred to PACU in stable condition.  Complications:  None  EBL:  Minimal  Specimen:  Gallbladder

## 2016-06-20 NOTE — Progress Notes (Signed)
Patient had difficulty deep breathing and coughing. Chest sounded tight to auscultation. Dr. Duwayne Heck ordered a nebulizer treatment. Patient is now 98 % on room air and is breathing much better.

## 2016-06-20 NOTE — Anesthesia Procedure Notes (Signed)
Procedure Name: Intubation Date/Time: 06/20/2016 7:45 AM Performed by: Andree Elk, AMY A Pre-anesthesia Checklist: Patient identified, Timeout performed, Emergency Drugs available and Suction available Patient Re-evaluated:Patient Re-evaluated prior to inductionOxygen Delivery Method: Circle System Utilized Preoxygenation: Pre-oxygenation with 100% oxygen Intubation Type: IV induction Laryngoscope Size: Miller and 3 Grade View: Grade I Tube type: Oral Tube size: 7.0 mm Number of attempts: 1 Airway Equipment and Method: Stylet Placement Confirmation: ETT inserted through vocal cords under direct vision,  positive ETCO2 and breath sounds checked- equal and bilateral Secured at: 21 cm Tube secured with: Tape Dental Injury: Teeth and Oropharynx as per pre-operative assessment

## 2016-06-20 NOTE — Anesthesia Preprocedure Evaluation (Signed)
Anesthesia Evaluation  Patient identified by MRN, date of birth, ID band Patient awake    Reviewed: Allergy & Precautions, NPO status , Patient's Chart, lab work & pertinent test results  Airway Mallampati: I  TM Distance: >3 FB Neck ROM: Full    Dental  (+) Teeth Intact   Pulmonary neg pulmonary ROS,    breath sounds clear to auscultation       Cardiovascular negative cardio ROS   Rhythm:Regular Rate:Normal     Neuro/Psych    GI/Hepatic GERD  Medicated,  Endo/Other    Renal/GU      Musculoskeletal   Abdominal   Peds  Hematology   Anesthesia Other Findings   Reproductive/Obstetrics                             Anesthesia Physical Anesthesia Plan  ASA: II  Anesthesia Plan: General   Post-op Pain Management:    Induction: Intravenous, Rapid sequence and Cricoid pressure planned  Airway Management Planned: Oral ETT  Additional Equipment:   Intra-op Plan:   Post-operative Plan: Extubation in OR  Informed Consent: I have reviewed the patients History and Physical, chart, labs and discussed the procedure including the risks, benefits and alternatives for the proposed anesthesia with the patient or authorized representative who has indicated his/her understanding and acceptance.     Plan Discussed with:   Anesthesia Plan Comments:         Anesthesia Quick Evaluation

## 2016-06-20 NOTE — Anesthesia Postprocedure Evaluation (Signed)
Anesthesia Post Note  Patient: Danielle Ritter  Procedure(s) Performed: Procedure(s) (LRB): LAPAROSCOPIC CHOLECYSTECTOMY (N/A)  Patient location during evaluation: PACU Anesthesia Type: General Level of consciousness: awake and alert and oriented Pain management: satisfactory to patient Vital Signs Assessment: post-procedure vital signs reviewed and stable Respiratory status: spontaneous breathing, nonlabored ventilation and respiratory function stable Cardiovascular status: stable Postop Assessment: no signs of nausea or vomiting and adequate PO intake Anesthetic complications: no    Last Vitals:  Vitals:   06/20/16 0845 06/20/16 0900  BP: (!) 150/84 (!) 142/79  Pulse: 74 85  Resp: (!) 24 15  Temp:      Last Pain:  Vitals:   06/20/16 0900  TempSrc:   PainSc: 3                  Denorris Reust

## 2016-06-20 NOTE — Transfer of Care (Signed)
Immediate Anesthesia Transfer of Care Note  Patient: Danielle Ritter  Procedure(s) Performed: Procedure(s): LAPAROSCOPIC CHOLECYSTECTOMY (N/A)  Patient Location: PACU  Anesthesia Type:General  Level of Consciousness: awake, alert , oriented and patient cooperative  Airway & Oxygen Therapy: Patient Spontanous Breathing and Patient connected to face mask oxygen  Post-op Assessment: Report given to RN and Post -op Vital signs reviewed and stable  Post vital signs: Reviewed and stable  Last Vitals:  Vitals:   06/20/16 0720 06/20/16 0725  BP: 116/66 120/67  Pulse:    Resp: 20 20  Temp:      Last Pain:  Vitals:   06/20/16 0647  TempSrc: Oral      Patients Stated Pain Goal: 5 ([id number])  Complications: No apparent anesthesia complications

## 2016-06-22 ENCOUNTER — Ambulatory Visit: Payer: Medicare Other | Admitting: Cardiovascular Disease

## 2016-06-29 ENCOUNTER — Encounter (HOSPITAL_COMMUNITY): Payer: Self-pay | Admitting: General Surgery

## 2016-08-07 DIAGNOSIS — Z23 Encounter for immunization: Secondary | ICD-10-CM | POA: Diagnosis not present

## 2016-08-09 ENCOUNTER — Ambulatory Visit (HOSPITAL_COMMUNITY)
Admission: RE | Admit: 2016-08-09 | Discharge: 2016-08-09 | Disposition: A | Discharge status: Home / Self Care | Payer: Medicare Other | Source: Ambulatory Visit | Attending: Internal Medicine | Admitting: Internal Medicine

## 2016-08-09 ENCOUNTER — Encounter (HOSPITAL_COMMUNITY)
Admission: RE | Disposition: A | Discharge status: Home / Self Care | Payer: Self-pay | Source: Ambulatory Visit | Attending: Internal Medicine

## 2016-08-09 ENCOUNTER — Encounter (HOSPITAL_COMMUNITY): Payer: Self-pay | Admitting: *Deleted

## 2016-08-09 DIAGNOSIS — K2210 Ulcer of esophagus without bleeding: Secondary | ICD-10-CM | POA: Insufficient documentation

## 2016-08-09 DIAGNOSIS — Z79899 Other long term (current) drug therapy: Secondary | ICD-10-CM | POA: Diagnosis not present

## 2016-08-09 DIAGNOSIS — K2270 Barrett's esophagus without dysplasia: Secondary | ICD-10-CM | POA: Diagnosis not present

## 2016-08-09 DIAGNOSIS — K219 Gastro-esophageal reflux disease without esophagitis: Secondary | ICD-10-CM | POA: Diagnosis not present

## 2016-08-09 DIAGNOSIS — K228 Other specified diseases of esophagus: Secondary | ICD-10-CM | POA: Diagnosis not present

## 2016-08-09 DIAGNOSIS — K449 Diaphragmatic hernia without obstruction or gangrene: Secondary | ICD-10-CM | POA: Insufficient documentation

## 2016-08-09 DIAGNOSIS — M1990 Unspecified osteoarthritis, unspecified site: Secondary | ICD-10-CM | POA: Insufficient documentation

## 2016-08-09 HISTORY — PX: ESOPHAGOGASTRODUODENOSCOPY: SHX5428

## 2016-08-09 SURGERY — EGD (ESOPHAGOGASTRODUODENOSCOPY)
Anesthesia: Moderate Sedation

## 2016-08-09 MED ORDER — MEPERIDINE HCL 50 MG/ML IJ SOLN
INTRAMUSCULAR | Status: DC | PRN
  Administered 2016-08-09 (×2): 25 mg via INTRAVENOUS

## 2016-08-09 MED ORDER — MEPERIDINE HCL 50 MG/ML IJ SOLN
INTRAMUSCULAR | Status: AC
  Filled 2016-08-09: qty 1

## 2016-08-09 MED ORDER — MIDAZOLAM HCL 5 MG/5ML IJ SOLN
INTRAMUSCULAR | Status: AC
  Filled 2016-08-09: qty 10

## 2016-08-09 MED ORDER — MIDAZOLAM HCL 5 MG/5ML IJ SOLN
INTRAMUSCULAR | Status: DC | PRN
Start: 2016-08-09 — End: 2016-08-09
  Administered 2016-08-09 (×2): 2 mg via INTRAVENOUS

## 2016-08-09 MED ORDER — BUTAMBEN-TETRACAINE-BENZOCAINE 2-2-14 % EX AERO
INHALATION_SPRAY | CUTANEOUS | Status: DC | PRN
  Administered 2016-08-09: 2 via TOPICAL

## 2016-08-09 NOTE — H&P (Signed)
Danielle Ritter is an 70 y.o. female.   Chief Complaint: Patient is here for EGD. HPI: Patient is 70 year old Caucasian female was history of GERD for more than 30 years. Tried to come off PPI but developed intractable heartburn. She is back on Prilosec 20 mg daily(she was on twice a day) and is doing well. She has occasional swallowing difficulty. She denies nausea vomiting abdominal pain or melena. Her upper GI tract has never been evaluated before.  Past Medical History:  Diagnosis Date  . Arthritis   . GERD (gastroesophageal reflux disease)     Past Surgical History:  Procedure Laterality Date  . ABDOMINAL HYSTERECTOMY    . APPENDECTOMY    . bladder tact    . BREAST SURGERY     Nipple revision  . CHOLECYSTECTOMY N/A 06/20/2016   Procedure: LAPAROSCOPIC CHOLECYSTECTOMY;  Surgeon: Aviva Signs, MD;  Location: [facility];  Service: General;  Laterality: N/A;  . hystorectomy      Family History  Problem Relation Age of Onset  . Emphysema Mother   . Cancer Father   . Heart failure Sister   . Diabetes Brother    Social History:  reports that she has never smoked. She has never used smokeless tobacco. She reports that she does not drink alcohol or use drugs.  Allergies:  Allergies  Allergen Reactions  . Codeine Nausea And Vomiting    Medications Prior to Admission  Medication Sig Dispense Refill  . acetaminophen (TYLENOL) 500 MG tablet Take 500-1,000 mg by mouth daily as needed for moderate pain.    . Calcium Carb-Cholecalciferol (CALCIUM 600-D PO) Take 1 tablet by mouth daily.    . Cyanocobalamin 2500 MCG CHEW Chew 2,500 mcg by mouth every other day.    . furosemide (LASIX) 20 MG tablet Take 20 mg by mouth daily as needed for fluid.     Danielle Ritter Oil 300 MG CAPS Take 300 mg by mouth daily.    Marland Kitchen loratadine (CLARITIN) 10 MG tablet Take 10 mg by mouth daily.    . naproxen sodium (ANAPROX) 220 MG tablet Take 220 mg by mouth daily as needed (pain).    Marland Kitchen omeprazole (PRILOSEC) 10 MG  capsule Take 10 mg by mouth daily. May take an additional 10mg s as needed for reflux    . vitamin E 400 UNIT capsule Take 800 Units by mouth daily.    . Carboxymethylcellulose Sodium (THERATEARS OP) Apply 1 drop to eye daily as needed (dry eyes).    Marland Kitchen HYDROcodone-acetaminophen (NORCO) 5-325 MG tablet Take 1-2 tablets by mouth every 6 (six) hours as needed for moderate pain. (Patient not taking: Reported on 08/06/2016) 40 tablet 0  . sodium chloride (OCEAN) 0.65 % SOLN nasal spray Place 1 spray into both nostrils as needed for congestion.      No results found for this or any previous visit (from the past 48 hour(s)). No results found.  ROS  Blood pressure (!) 145/70, pulse 70, temperature 97.7 F (36.5 C), temperature source Oral, resp. rate 15, height 5\' 1"  (1.549 m), weight 153 lb (69.4 kg), SpO2 100 %. Physical Exam  Constitutional: She appears well-developed and well-nourished.  HENT:  Mouth/Throat: Oropharynx is clear and moist.  Eyes: Conjunctivae are normal. No scleral icterus.  Neck: No thyromegaly present.  Cardiovascular: Normal rate, regular rhythm and normal heart sounds.   No murmur heard. Respiratory: Effort normal and breath sounds normal.  GI: Soft. She exhibits no distension and no mass. There is no tenderness.  Musculoskeletal: She exhibits no edema.  Lymphadenopathy:    She has no cervical adenopathy.  Neurological: She is alert.  Skin: Skin is warm and dry.     Assessment/Plan Chronic GERD. Diagnostic EGD.  Hildred Laser, MD 08/09/2016, 2:06 PM

## 2016-08-09 NOTE — Progress Notes (Signed)
Patient states that her tooth on the lower left side is feeling fine after her endoscopy and that it is like it was when she came in

## 2016-08-09 NOTE — Discharge Instructions (Signed)
Resume usual medications and diet. No driving for 24 hours. Physician will call with biopsy results.  Gastrointestinal Endoscopy, Care After Refer to this sheet in the next few weeks. These instructions provide you with information on caring for yourself after your procedure. Your caregiver may also give you more specific instructions. Your treatment has been planned according to current medical practices, but problems sometimes occur. Call your caregiver if you have any problems or questions after your procedure. HOME CARE INSTRUCTIONS  If you were given medicine to help you relax (sedative), do not drive, operate machinery, or sign important documents for 24 hours.  Avoid alcohol and hot or warm beverages for the first 24 hours after the procedure.  Only take over-the-counter or prescription medicines for pain, discomfort, or fever as directed by your caregiver. You may resume taking your normal medicines unless your caregiver tells you otherwise. Ask your caregiver when you may resume taking medicines that may cause bleeding, such as aspirin, clopidogrel, or warfarin.  You may return to your normal diet and activities on the day after your procedure, or as directed by your caregiver. Walking may help to reduce any bloated feeling in your abdomen.  Drink enough fluids to keep your urine clear or pale yellow.  You may gargle with salt water if you have a sore throat. SEEK IMMEDIATE MEDICAL CARE IF:  You have severe nausea or vomiting.  You have severe abdominal pain, abdominal cramps that last longer than 6 hours, or abdominal swelling (distention).  You have severe shoulder or back pain.  You have trouble swallowing.  You have shortness of breath, your breathing is shallow, or you are breathing faster than normal.  You have a fever or a rapid heartbeat.  You vomit blood or material that looks like coffee grounds.  You have bloody, black, or tarry stools. MAKE SURE  YOU:  Understand these instructions.  Will watch your condition.  Will get help right away if you are not doing well or get worse.   This information is not intended to replace advice given to you by your health care provider. Make sure you discuss any questions you have with your health care provider.   Document Released: 05/15/2004 Document Revised: 10/22/2014 Document Reviewed: 01/01/2012 Elsevier Interactive Patient Education 2016 Elsevier Inc.    Hiatal Hernia A hiatal hernia occurs when part of your stomach slides above the muscle that separates your abdomen from your chest (diaphragm). You can be born with a hiatal hernia (congenital), or it may develop over time. In almost all cases of hiatal hernia, only the top part of the stomach pushes through.  Many people have a hiatal hernia with no symptoms. The larger the hernia, the more likely that you will have symptoms. In some cases, a hiatal hernia allows stomach acid to flow back into the tube that carries food from your mouth to your stomach (esophagus). This may cause heartburn symptoms. Severe heartburn symptoms may mean you have developed a condition called gastroesophageal reflux disease (GERD).  CAUSES  Hiatal hernias are caused by a weakness in the opening (hiatus) where your esophagus passes through your diaphragm to attach to the upper part of your stomach. You may be born with a weakness in your hiatus, or a weakness can develop. RISK FACTORS Older age is a major risk factor for a hiatal hernia. Anything that increases pressure on your diaphragm can also increase your risk of a hiatal hernia. This includes:  Pregnancy.  Excess weight.  Frequent  constipation. SIGNS AND SYMPTOMS  People with a hiatal hernia often have no symptoms. If symptoms develop, they are almost always caused by GERD. They may include:  Heartburn.  Belching.  Indigestion.  Trouble swallowing.  Coughing or wheezing.  Sore  throat.  Hoarseness.  Chest pain. DIAGNOSIS  A hiatal hernia is sometimes found during an exam for another problem. Your health care provider may suspect a hiatal hernia if you have symptoms of GERD. Tests may be done to diagnose GERD. These may include:  X-rays of your stomach or chest.  An upper gastrointestinal (GI) series. This is an X-ray exam of your GI tract involving the use of a chalky liquid that you swallow. The liquid shows up clearly on the X-ray.  Endoscopy. This is a procedure to look into your stomach using a thin, flexible tube that has a tiny camera and light on the end of it. TREATMENT  If you have no symptoms, you may not need treatment. If you have symptoms, treatment may include:  Dietary and lifestyle changes to help reduce GERD symptoms.  Medicines. These may include:  Over-the-counter antacids.  Medicines that make your stomach empty more quickly.  Medicines that block the production of stomach acid (H2 blockers).  Stronger medicines to reduce stomach acid (proton pump inhibitors).  You may need surgery to repair the hernia if other treatments are not helping. HOME CARE INSTRUCTIONS   Take all medicines as directed by your health care provider.  Quit smoking, if you smoke.  Try to achieve and maintain a healthy body weight.  Eat frequent small meals instead of three large meals a day. This keeps your stomach from getting too full.  Eat slowly.  Do not lie down right after eating.  Do noteat 1-2 hours before bed.   Do not drink beverages with caffeine. These include cola, coffee, cocoa, and tea.  Do not drink alcohol.  Avoid foods that can make symptoms of GERD worse. These may include:  Fatty foods.  Citrus fruits.  Other foods and drinks that contain acid.  Avoid putting pressure on your belly. Anything that puts pressure on your belly increases the amount of acid that may be pushed up into your esophagus.   Avoid bending over,  especially after eating.  Raise the head of your bed by putting blocks under the legs. This keeps your head and esophagus higher than your stomach.  Do not wear tight clothing around your chest or stomach.  Try not to strain when having a bowel movement, when urinating, or when lifting heavy objects. SEEK MEDICAL CARE IF:  Your symptoms are not controlled with medicines or lifestyle changes.  You are having trouble swallowing.  You have coughing or wheezing that will not go away. SEEK IMMEDIATE MEDICAL CARE IF:  Your pain is getting worse.  Your pain spreads to your arms, neck, jaw, teeth, or back.  You have shortness of breath.  You sweat for no reason.  You feel sick to your stomach (nauseous) or vomit.  You vomit blood.  You have bright red blood in your stools.  You have black, tarry stools.    This information is not intended to replace advice given to you by your health care provider. Make sure you discuss any questions you have with your health care provider.   Document Released: 12/22/2003 Document Revised: 10/22/2014 Document Reviewed: 09/18/2013 Elsevier Interactive Patient Education [employer].

## 2016-08-09 NOTE — Op Note (Signed)
[facility] Patient Name: Danielle Ritter Procedure Date: 08/09/2016 1:56 PM MRN: [id number] Date of Birth: 04/05/1946 Attending MD: Hildred Laser , MD CSN: [id number] Age: 70 Admit Type: Outpatient Procedure:                Upper GI endoscopy Indications:              Follow-up of gastro-esophageal reflux disease Providers:                Hildred Laser, MD, Lurline Del, RN, Charlyne Petrin                            RN, RN, Purcell Nails. [place], Technician Referring MD:             Halford Chessman, MD Medicines:                Cetacaine spray, Meperidine 50 mg IV, Midazolam 4                            mg IV Complications:            No immediate complications. Estimated Blood Loss:     Estimated blood loss: none. Procedure:                Pre-Anesthesia Assessment:                           - Prior to the procedure, a History and Physical                            was performed, and patient medications and                            allergies were reviewed. The patient's tolerance of                            previous anesthesia was also reviewed. The risks                            and benefits of the procedure and the sedation                            options and risks were discussed with the patient.                            All questions were answered, and informed consent                            was obtained. Prior Anticoagulants: The patient                            last took naproxen 7 days prior to the procedure.                            ASA Grade Assessment: I - A normal, healthy  patient. After reviewing the risks and benefits,                            the patient was deemed in satisfactory condition to                            undergo the procedure.                           After obtaining informed consent, the endoscope was                            passed under direct vision. Throughout the   procedure, the patient's blood pressure, pulse, and                            oxygen saturations were monitored continuously. The                            [id number]) scope was introduced through the                            mouth, and advanced to the second part of duodenum.                            The upper GI endoscopy was accomplished without                            difficulty. The patient tolerated the procedure                            well. Scope In: 2:15:11 PM Scope Out: 2:23:26 PM Total Procedure Duration: 0 hours 8 minutes 15 seconds  Findings:      The examined esophagus was normal.      The Z-line was irregular and was found 37 cm from the incisors. Biopsies       were taken with a cold forceps for histology.      A 2 cm hiatal hernia was present.      The entire examined stomach was normal.      The duodenal bulb and second portion of the duodenum were normal. Impression:               - Normal esophagus.                           - Z-line irregular, 37 cm from the incisors.                            Biopsied.                           - 2 cm hiatal hernia.                           - Normal stomach.                           -  Normal duodenal bulb and second portion of the                            duodenum. Moderate Sedation:      Moderate (conscious) sedation was administered by the endoscopy nurse       and supervised by the endoscopist. The following parameters were       monitored: oxygen saturation, heart rate, blood pressure, CO2       capnography and response to care. Total physician intraservice time was       13 minutes. Recommendation:           - Patient has a contact number available for                            emergencies. The signs and symptoms of potential                            delayed complications were discussed with the                            patient. Return to normal activities tomorrow.                             Written discharge instructions were provided to the                            patient.                           - Resume previous diet today.                           - Continue present medications.                           - Await pathology results. Procedure Code(s):        --- Professional ---                           [id number], Esophagogastroduodenoscopy, flexible,                            transoral; with biopsy, single or multiple                           99152, Moderate sedation services provided by the                            same physician or other qualified health care                            professional performing the diagnostic or                            therapeutic service that the sedation supports,  requiring the presence of an independent trained                            observer to assist in the monitoring of the                            patient's level of consciousness and physiological                            status; initial 15 minutes of intraservice time,                            patient age 57 years or older Diagnosis Code(s):        --- Professional ---                           K22.8, Other specified diseases of esophagus                           K44.9, Diaphragmatic hernia without obstruction or                            gangrene                           K21.9, Gastro-esophageal reflux disease without                            esophagitis CPT copyright 2016 American Medical Association. All rights reserved. The codes documented in this report are preliminary and upon coder review may  be revised to meet current compliance requirements. Hildred Laser, MD Hildred Laser, MD 08/09/2016 2:31:13 PM This report has been signed electronically. Number of Addenda: 0

## 2016-08-15 ENCOUNTER — Encounter (HOSPITAL_COMMUNITY): Payer: Self-pay | Admitting: Internal Medicine

## 2016-08-15 DIAGNOSIS — Z1389 Encounter for screening for other disorder: Secondary | ICD-10-CM | POA: Diagnosis not present

## 2016-08-15 DIAGNOSIS — E784 Other hyperlipidemia: Secondary | ICD-10-CM | POA: Diagnosis not present

## 2016-08-15 DIAGNOSIS — R7301 Impaired fasting glucose: Secondary | ICD-10-CM | POA: Diagnosis not present

## 2016-08-15 DIAGNOSIS — K219 Gastro-esophageal reflux disease without esophagitis: Secondary | ICD-10-CM | POA: Diagnosis not present

## 2016-08-15 DIAGNOSIS — Z0000 Encounter for general adult medical examination without abnormal findings: Secondary | ICD-10-CM | POA: Diagnosis not present

## 2016-08-15 DIAGNOSIS — E782 Mixed hyperlipidemia: Secondary | ICD-10-CM | POA: Diagnosis not present

## 2016-09-11 DIAGNOSIS — Z1211 Encounter for screening for malignant neoplasm of colon: Secondary | ICD-10-CM | POA: Diagnosis not present

## 2017-07-12 DIAGNOSIS — Z23 Encounter for immunization: Secondary | ICD-10-CM | POA: Diagnosis not present

## 2017-09-11 DIAGNOSIS — Z1389 Encounter for screening for other disorder: Secondary | ICD-10-CM | POA: Diagnosis not present

## 2017-09-11 DIAGNOSIS — M810 Age-related osteoporosis without current pathological fracture: Secondary | ICD-10-CM | POA: Diagnosis not present

## 2017-09-11 DIAGNOSIS — R946 Abnormal results of thyroid function studies: Secondary | ICD-10-CM | POA: Diagnosis not present

## 2017-09-11 DIAGNOSIS — E785 Hyperlipidemia, unspecified: Secondary | ICD-10-CM | POA: Diagnosis not present

## 2017-09-11 DIAGNOSIS — K219 Gastro-esophageal reflux disease without esophagitis: Secondary | ICD-10-CM | POA: Diagnosis not present

## 2017-09-11 DIAGNOSIS — E559 Vitamin D deficiency, unspecified: Secondary | ICD-10-CM | POA: Diagnosis not present

## 2017-09-11 DIAGNOSIS — E782 Mixed hyperlipidemia: Secondary | ICD-10-CM | POA: Diagnosis not present

## 2017-09-11 DIAGNOSIS — N390 Urinary tract infection, site not specified: Secondary | ICD-10-CM | POA: Diagnosis not present

## 2017-09-11 DIAGNOSIS — Z0001 Encounter for general adult medical examination with abnormal findings: Secondary | ICD-10-CM | POA: Diagnosis not present

## 2017-09-12 ENCOUNTER — Other Ambulatory Visit (HOSPITAL_COMMUNITY): Payer: Self-pay | Admitting: Family Medicine

## 2017-09-12 DIAGNOSIS — M810 Age-related osteoporosis without current pathological fracture: Secondary | ICD-10-CM

## 2017-09-19 ENCOUNTER — Ambulatory Visit (HOSPITAL_COMMUNITY)
Admission: RE | Admit: 2017-09-19 | Discharge: 2017-09-19 | Disposition: A | Discharge status: Home / Self Care | Payer: Medicare Other | Source: Ambulatory Visit | Attending: Family Medicine | Admitting: Family Medicine

## 2017-09-19 DIAGNOSIS — M810 Age-related osteoporosis without current pathological fracture: Secondary | ICD-10-CM

## 2017-09-19 DIAGNOSIS — M25462 Effusion, left knee: Secondary | ICD-10-CM | POA: Diagnosis not present

## 2017-09-19 DIAGNOSIS — M1712 Unilateral primary osteoarthritis, left knee: Secondary | ICD-10-CM | POA: Insufficient documentation

## 2017-09-19 DIAGNOSIS — M23212 Derangement of anterior horn of medial meniscus due to old tear or injury, left knee: Secondary | ICD-10-CM | POA: Insufficient documentation

## 2017-09-19 DIAGNOSIS — M25562 Pain in left knee: Secondary | ICD-10-CM | POA: Diagnosis not present

## 2017-09-19 DIAGNOSIS — M659 Synovitis and tenosynovitis, unspecified: Secondary | ICD-10-CM | POA: Diagnosis not present

## 2017-11-11 DIAGNOSIS — M170 Bilateral primary osteoarthritis of knee: Secondary | ICD-10-CM | POA: Diagnosis not present

## 2017-11-11 DIAGNOSIS — M1712 Unilateral primary osteoarthritis, left knee: Secondary | ICD-10-CM | POA: Diagnosis not present

## 2018-06-17 DIAGNOSIS — Z0001 Encounter for general adult medical examination with abnormal findings: Secondary | ICD-10-CM | POA: Diagnosis not present

## 2018-06-17 DIAGNOSIS — Z23 Encounter for immunization: Secondary | ICD-10-CM | POA: Diagnosis not present

## 2018-06-17 DIAGNOSIS — M25562 Pain in left knee: Secondary | ICD-10-CM | POA: Diagnosis not present

## 2018-06-17 DIAGNOSIS — R7309 Other abnormal glucose: Secondary | ICD-10-CM | POA: Diagnosis not present

## 2018-06-17 DIAGNOSIS — Z1389 Encounter for screening for other disorder: Secondary | ICD-10-CM | POA: Diagnosis not present

## 2018-06-17 DIAGNOSIS — E782 Mixed hyperlipidemia: Secondary | ICD-10-CM | POA: Diagnosis not present

## 2018-06-24 DIAGNOSIS — G4730 Sleep apnea, unspecified: Secondary | ICD-10-CM | POA: Diagnosis not present

## 2018-06-25 DIAGNOSIS — G4730 Sleep apnea, unspecified: Secondary | ICD-10-CM | POA: Diagnosis not present

## 2018-07-01 DIAGNOSIS — R221 Localized swelling, mass and lump, neck: Secondary | ICD-10-CM | POA: Diagnosis not present

## 2018-07-01 DIAGNOSIS — E034 Atrophy of thyroid (acquired): Secondary | ICD-10-CM | POA: Diagnosis not present

## 2019-02-02 DIAGNOSIS — Z0000 Encounter for general adult medical examination without abnormal findings: Secondary | ICD-10-CM | POA: Diagnosis not present

## 2019-02-02 DIAGNOSIS — Z1389 Encounter for screening for other disorder: Secondary | ICD-10-CM | POA: Diagnosis not present

## 2019-02-02 DIAGNOSIS — M810 Age-related osteoporosis without current pathological fracture: Secondary | ICD-10-CM | POA: Diagnosis not present

## 2019-03-31 ENCOUNTER — Telehealth: Payer: Self-pay

## 2019-03-31 NOTE — Telephone Encounter (Signed)
Holly from [facility] left a voice mail on the nurse line regarding records for this patient. Records faxed 03/31/2019 at 9:38am.

## 2019-06-08 DIAGNOSIS — M1991 Primary osteoarthritis, unspecified site: Secondary | ICD-10-CM | POA: Diagnosis not present

## 2019-06-08 DIAGNOSIS — Z1389 Encounter for screening for other disorder: Secondary | ICD-10-CM | POA: Diagnosis not present

## 2019-06-08 DIAGNOSIS — R7309 Other abnormal glucose: Secondary | ICD-10-CM | POA: Diagnosis not present

## 2019-06-08 DIAGNOSIS — E7849 Other hyperlipidemia: Secondary | ICD-10-CM | POA: Diagnosis not present

## 2019-06-12 ENCOUNTER — Other Ambulatory Visit (HOSPITAL_COMMUNITY): Payer: Self-pay | Admitting: Family Medicine

## 2019-06-12 DIAGNOSIS — E2839 Other primary ovarian failure: Secondary | ICD-10-CM

## 2019-07-02 ENCOUNTER — Encounter (HOSPITAL_COMMUNITY): Payer: Self-pay

## 2019-07-02 ENCOUNTER — Other Ambulatory Visit (HOSPITAL_COMMUNITY): Payer: Medicare Other

## 2019-07-22 DIAGNOSIS — R6889 Other general symptoms and signs: Secondary | ICD-10-CM | POA: Diagnosis not present

## 2019-08-06 DIAGNOSIS — H524 Presbyopia: Secondary | ICD-10-CM | POA: Diagnosis not present

## 2019-08-06 DIAGNOSIS — H35371 Puckering of macula, right eye: Secondary | ICD-10-CM | POA: Diagnosis not present

## 2019-08-11 DIAGNOSIS — R946 Abnormal results of thyroid function studies: Secondary | ICD-10-CM | POA: Diagnosis not present

## 2019-08-11 DIAGNOSIS — Z23 Encounter for immunization: Secondary | ICD-10-CM | POA: Diagnosis not present

## 2019-08-30 DIAGNOSIS — Z1211 Encounter for screening for malignant neoplasm of colon: Secondary | ICD-10-CM | POA: Diagnosis not present

## 2019-09-16 DIAGNOSIS — E039 Hypothyroidism, unspecified: Secondary | ICD-10-CM | POA: Diagnosis not present

## 2019-11-18 ENCOUNTER — Encounter (HOSPITAL_COMMUNITY): Payer: Self-pay

## 2019-11-18 ENCOUNTER — Other Ambulatory Visit (HOSPITAL_COMMUNITY): Payer: Medicare Other

## 2019-12-08 DIAGNOSIS — H40013 Open angle with borderline findings, low risk, bilateral: Secondary | ICD-10-CM | POA: Diagnosis not present

## 2019-12-08 DIAGNOSIS — H2512 Age-related nuclear cataract, left eye: Secondary | ICD-10-CM | POA: Diagnosis not present

## 2020-04-28 ENCOUNTER — Ambulatory Visit (HOSPITAL_COMMUNITY)
Admission: RE | Admit: 2020-04-28 | Discharge: 2020-04-28 | Disposition: A | Discharge status: Home / Self Care | Payer: Medicare Other | Source: Ambulatory Visit | Attending: Family Medicine | Admitting: Family Medicine

## 2020-04-28 ENCOUNTER — Other Ambulatory Visit: Payer: Self-pay

## 2020-04-28 DIAGNOSIS — E2839 Other primary ovarian failure: Secondary | ICD-10-CM

## 2020-04-28 DIAGNOSIS — R29890 Loss of height: Secondary | ICD-10-CM | POA: Diagnosis not present

## 2020-04-28 DIAGNOSIS — M85852 Other specified disorders of bone density and structure, left thigh: Secondary | ICD-10-CM | POA: Diagnosis not present

## 2020-04-28 DIAGNOSIS — Z780 Asymptomatic menopausal state: Secondary | ICD-10-CM | POA: Diagnosis not present

## 2020-06-03 DIAGNOSIS — Z0001 Encounter for general adult medical examination with abnormal findings: Secondary | ICD-10-CM | POA: Diagnosis not present

## 2020-06-03 DIAGNOSIS — E7849 Other hyperlipidemia: Secondary | ICD-10-CM | POA: Diagnosis not present

## 2020-06-03 DIAGNOSIS — M1991 Primary osteoarthritis, unspecified site: Secondary | ICD-10-CM | POA: Diagnosis not present

## 2020-06-03 DIAGNOSIS — Z1389 Encounter for screening for other disorder: Secondary | ICD-10-CM | POA: Diagnosis not present

## 2020-06-03 DIAGNOSIS — E039 Hypothyroidism, unspecified: Secondary | ICD-10-CM | POA: Diagnosis not present

## 2020-06-03 DIAGNOSIS — E782 Mixed hyperlipidemia: Secondary | ICD-10-CM | POA: Diagnosis not present

## 2020-06-03 DIAGNOSIS — E559 Vitamin D deficiency, unspecified: Secondary | ICD-10-CM | POA: Diagnosis not present

## 2020-06-03 DIAGNOSIS — R7309 Other abnormal glucose: Secondary | ICD-10-CM | POA: Diagnosis not present

## 2020-06-03 DIAGNOSIS — Z0000 Encounter for general adult medical examination without abnormal findings: Secondary | ICD-10-CM | POA: Diagnosis not present

## 2020-07-29 DIAGNOSIS — Z23 Encounter for immunization: Secondary | ICD-10-CM | POA: Diagnosis not present

## 2021-03-15 DEATH — deceased

## 2021-06-14 DIAGNOSIS — E782 Mixed hyperlipidemia: Secondary | ICD-10-CM | POA: Diagnosis not present

## 2021-06-14 DIAGNOSIS — K219 Gastro-esophageal reflux disease without esophagitis: Secondary | ICD-10-CM | POA: Diagnosis not present

## 2021-06-14 DIAGNOSIS — E559 Vitamin D deficiency, unspecified: Secondary | ICD-10-CM | POA: Diagnosis not present

## 2021-06-14 DIAGNOSIS — Z0001 Encounter for general adult medical examination with abnormal findings: Secondary | ICD-10-CM | POA: Diagnosis not present

## 2021-06-14 DIAGNOSIS — E039 Hypothyroidism, unspecified: Secondary | ICD-10-CM | POA: Diagnosis not present

## 2021-06-14 DIAGNOSIS — Z1389 Encounter for screening for other disorder: Secondary | ICD-10-CM | POA: Diagnosis not present

## 2021-06-14 DIAGNOSIS — R7309 Other abnormal glucose: Secondary | ICD-10-CM | POA: Diagnosis not present

## 2021-07-10 DIAGNOSIS — Z23 Encounter for immunization: Secondary | ICD-10-CM | POA: Diagnosis not present

## 2021-08-18 DIAGNOSIS — H524 Presbyopia: Secondary | ICD-10-CM | POA: Diagnosis not present

## 2021-08-18 DIAGNOSIS — H40011 Open angle with borderline findings, low risk, right eye: Secondary | ICD-10-CM | POA: Diagnosis not present

## 2021-09-14 DIAGNOSIS — H04223 Epiphora due to insufficient drainage, bilateral lacrimal glands: Secondary | ICD-10-CM | POA: Diagnosis not present

## 2021-11-20 ENCOUNTER — Other Ambulatory Visit: Payer: Self-pay | Admitting: Family Medicine

## 2021-11-22 ENCOUNTER — Other Ambulatory Visit (HOSPITAL_COMMUNITY): Payer: Self-pay | Admitting: Family Medicine

## 2021-11-22 ENCOUNTER — Ambulatory Visit (HOSPITAL_COMMUNITY)
Admission: RE | Admit: 2021-11-22 | Discharge: 2021-11-22 | Disposition: A | Discharge status: Home / Self Care | Payer: Medicare Other | Source: Ambulatory Visit | Attending: Family Medicine | Admitting: Family Medicine

## 2021-11-22 ENCOUNTER — Other Ambulatory Visit: Payer: Self-pay

## 2021-11-22 DIAGNOSIS — R0789 Other chest pain: Secondary | ICD-10-CM

## 2021-11-22 DIAGNOSIS — M79622 Pain in left upper arm: Secondary | ICD-10-CM | POA: Diagnosis not present

## 2021-11-22 DIAGNOSIS — I700 Atherosclerosis of aorta: Secondary | ICD-10-CM | POA: Diagnosis not present

## 2021-11-22 DIAGNOSIS — N644 Mastodynia: Secondary | ICD-10-CM | POA: Diagnosis not present

## 2021-11-22 DIAGNOSIS — R079 Chest pain, unspecified: Secondary | ICD-10-CM | POA: Diagnosis not present

## 2021-11-22 DIAGNOSIS — Z9049 Acquired absence of other specified parts of digestive tract: Secondary | ICD-10-CM | POA: Diagnosis not present

## 2021-11-23 ENCOUNTER — Other Ambulatory Visit: Payer: Self-pay | Admitting: Family Medicine

## 2021-11-23 ENCOUNTER — Other Ambulatory Visit (HOSPITAL_COMMUNITY): Payer: Self-pay | Admitting: Family Medicine

## 2021-11-23 DIAGNOSIS — N644 Mastodynia: Secondary | ICD-10-CM

## 2021-12-01 ENCOUNTER — Ambulatory Visit: Admission: RE | Admit: 2021-12-01 | Payer: Medicare Other | Source: Ambulatory Visit

## 2021-12-01 ENCOUNTER — Other Ambulatory Visit: Payer: Self-pay | Admitting: Family Medicine

## 2021-12-01 ENCOUNTER — Ambulatory Visit
Admission: RE | Admit: 2021-12-01 | Discharge: 2021-12-01 | Disposition: A | Discharge status: Home / Self Care | Payer: Medicare Other | Source: Ambulatory Visit | Attending: Family Medicine | Admitting: Family Medicine

## 2021-12-01 DIAGNOSIS — R922 Inconclusive mammogram: Secondary | ICD-10-CM | POA: Diagnosis not present

## 2021-12-01 DIAGNOSIS — N644 Mastodynia: Secondary | ICD-10-CM

## 2021-12-01 DIAGNOSIS — R921 Mammographic calcification found on diagnostic imaging of breast: Secondary | ICD-10-CM

## 2022-01-17 DIAGNOSIS — Z1211 Encounter for screening for malignant neoplasm of colon: Secondary | ICD-10-CM | POA: Diagnosis not present

## 2022-05-15 DIAGNOSIS — C801 Malignant (primary) neoplasm, unspecified: Secondary | ICD-10-CM

## 2022-05-15 HISTORY — DX: Malignant (primary) neoplasm, unspecified: C80.1

## 2022-06-15 ENCOUNTER — Other Ambulatory Visit: Payer: Self-pay | Admitting: Family Medicine

## 2022-06-15 ENCOUNTER — Ambulatory Visit
Admission: RE | Admit: 2022-06-15 | Discharge: 2022-06-15 | Disposition: A | Discharge status: Home / Self Care | Payer: Medicare Other | Source: Ambulatory Visit | Attending: Family Medicine | Admitting: Family Medicine

## 2022-06-15 DIAGNOSIS — R921 Mammographic calcification found on diagnostic imaging of breast: Secondary | ICD-10-CM

## 2022-06-15 DIAGNOSIS — R928 Other abnormal and inconclusive findings on diagnostic imaging of breast: Secondary | ICD-10-CM | POA: Diagnosis not present

## 2022-06-28 ENCOUNTER — Ambulatory Visit
Admission: RE | Admit: 2022-06-28 | Discharge: 2022-06-28 | Disposition: A | Discharge status: Home / Self Care | Payer: Medicare Other | Source: Ambulatory Visit | Attending: Family Medicine | Admitting: Family Medicine

## 2022-06-28 ENCOUNTER — Other Ambulatory Visit: Payer: Self-pay | Admitting: Family Medicine

## 2022-06-28 DIAGNOSIS — R921 Mammographic calcification found on diagnostic imaging of breast: Secondary | ICD-10-CM

## 2022-06-28 DIAGNOSIS — D0511 Intraductal carcinoma in situ of right breast: Secondary | ICD-10-CM | POA: Diagnosis not present

## 2022-07-02 ENCOUNTER — Ambulatory Visit: Payer: Self-pay | Admitting: Surgery

## 2022-07-02 DIAGNOSIS — D0511 Intraductal carcinoma in situ of right breast: Secondary | ICD-10-CM

## 2022-07-03 ENCOUNTER — Other Ambulatory Visit: Payer: Self-pay | Admitting: Surgery

## 2022-07-03 DIAGNOSIS — D0511 Intraductal carcinoma in situ of right breast: Secondary | ICD-10-CM

## 2022-07-04 ENCOUNTER — Other Ambulatory Visit: Payer: Self-pay | Admitting: *Deleted

## 2022-07-04 ENCOUNTER — Telehealth: Payer: Self-pay | Admitting: Hematology and Oncology

## 2022-07-04 DIAGNOSIS — D0511 Intraductal carcinoma in situ of right breast: Secondary | ICD-10-CM | POA: Insufficient documentation

## 2022-07-04 NOTE — Telephone Encounter (Signed)
Scheduled appt per 9/19 staff msg from navigator. Pt is aware of appt date and time. Pt is aware to arrive 15 mins prior to appt time and to bring and updated insurance card. Pt is aware of appt location.

## 2022-07-10 ENCOUNTER — Encounter (HOSPITAL_BASED_OUTPATIENT_CLINIC_OR_DEPARTMENT_OTHER): Payer: Self-pay | Admitting: Surgery

## 2022-07-10 ENCOUNTER — Ambulatory Visit: Admission: RE | Admit: 2022-07-10 | Payer: Medicare Other | Source: Ambulatory Visit

## 2022-07-10 ENCOUNTER — Telehealth: Payer: Self-pay | Admitting: Radiation Oncology

## 2022-07-10 ENCOUNTER — Ambulatory Visit
Admission: RE | Admit: 2022-07-10 | Discharge: 2022-07-10 | Disposition: A | Discharge status: Home / Self Care | Payer: Medicare Other | Source: Ambulatory Visit | Attending: Radiation Oncology | Admitting: Radiation Oncology

## 2022-07-10 ENCOUNTER — Other Ambulatory Visit: Payer: Self-pay

## 2022-07-10 NOTE — Progress Notes (Addendum)
Patient call stating that she needs to cancel her appointment for today and reschedule. Notified Dr. Isidore Moos and Wells Guiles in Scheduling.

## 2022-07-10 NOTE — Telephone Encounter (Signed)
LVM to r/s missed appt with Dr. Isidore Moos today 9/26

## 2022-07-12 NOTE — Progress Notes (Signed)
Radiation Oncology         [phone] ________________________________  Initial Outpatient Consultation by telephone.  The patient opted for telemedicine to maximize safety during the pandemic.  MyChart video was not obtainable.   Name: Danielle Ritter MRN: [id number]  Date: 07/13/2022  DOB: [birth year]  NT:IRWERXV, Jenny Reichmann, MD  Erroll Luna, MD   REFERRING PHYSICIAN: Erroll Luna, MD  DIAGNOSIS:    ICD-10-CM   1. Ductal carcinoma in situ (DCIS) of right breast  D05.11       Stage 0 (cTis (DCIS), cN0, cM0) Right Breast, Intermediate-grade DCIS, ER- / PR- / Her2 not assessed  CHIEF COMPLAINT: Here to discuss management of right breast DCIS  HISTORY OF PRESENT ILLNESS::Danielle Ritter is a 76 y.o. female who initially presented this past February with waxing and waning left breast pain along the lateral breast with extension into the axilla. This prompted a diagnostic bilateral mammogram on 12/01/21 which showed likely benign right breast calcifications. Follow-up diagnostic right breast mammogram on 06/15/22 showed suspicious appearing calcifications spanning 2.7 cm in the outer right breast. No associated mass, distortions, or abnormal appearing axillary lymph nodes were appreciated (however a targeted ultrasound was not performed).    Biopsy of the right breast calcifications on date of 06/28/22 showed intermediate grade DCIS measuring 0.4 cm in the greatest linear extent of the sample with necrosis.  ER status: 0% negative; PR status 0% negative, Her2 not assessed. No lymph nodes were examined.   Accordingly, the patient was referred to Dr. Brantley Stage on 07/02/22 to discuss surgical treatment options. Following discussion, the patient agreed to proceed with breast conserving surgery. She is scheduled for surgery on 07/17/22 with Dr. Brantley Stage.   She is with her husband today over the phone.  They live in [place].  She has a family history including prostate cancer, ovarian cancer,   and lymphoma.  She is a retired Regulatory affairs officer.  PREVIOUS RADIATION THERAPY: No  PAST MEDICAL HISTORY:  has a past medical history of Allergies, Arthritis, Cancer ([place]) (05/2022), GERD (gastroesophageal reflux disease), and Hypothyroidism.    PAST SURGICAL HISTORY: Past Surgical History:  Procedure Laterality Date   ABDOMINAL HYSTERECTOMY     APPENDECTOMY     bladder tact     BREAST SURGERY     Nipple revision   CHOLECYSTECTOMY N/A 06/20/2016   Procedure: LAPAROSCOPIC CHOLECYSTECTOMY;  Surgeon: Aviva Signs, MD;  Location: [facility];  Service: General;  Laterality: N/A;   ESOPHAGOGASTRODUODENOSCOPY N/A 08/09/2016   Procedure: ESOPHAGOGASTRODUODENOSCOPY (EGD);  Surgeon: Rogene Houston, MD;  Location: [facility];  Service: Endoscopy;  Laterality: N/A;  3:00 - moved to 10/26 @ 2:25   hystorectomy      FAMILY HISTORY: family history includes Diabetes in her brother; Emphysema in her mother; Heart failure in her sister; Hodgkin's lymphoma in her father; Ovarian cancer in her sister; Prostate cancer in her brother.  SOCIAL HISTORY:  reports that she has never smoked. She has never used smokeless tobacco. She reports that she does not drink alcohol and does not use drugs.  ALLERGIES: Ciprofloxacin and Codeine  MEDICATIONS:  Current Outpatient Medications  Medication Sig Dispense Refill   levothyroxine (SYNTHROID) 50 MCG tablet Take 50 mcg by mouth daily before breakfast.     omeprazole (PRILOSEC) 10 MG capsule Take 10 mg by mouth daily. May take an additional $RemoveBefor'10mg'[id number]$ s as needed for reflux     acetaminophen (TYLENOL) 500 MG tablet Take 500-1,000 mg by mouth daily as needed for moderate pain.  Calcium Carb-Cholecalciferol (CALCIUM 600-D PO) Take 1 tablet by mouth daily. (Patient not taking: Reported on 07/13/2022)     cholecalciferol (VITAMIN D3) 25 MCG (1000 UNIT) tablet Take 1,000 Units by mouth daily. (Patient not taking: Reported on 07/13/2022)     CINNAMON PO Take 1 capsule by mouth daily.  (Patient not taking: Reported on 07/13/2022)     Cyanocobalamin 2500 MCG CHEW Chew 2,500 mcg by mouth every other day. (Patient not taking: Reported on 07/13/2022)     diclofenac Sodium (VOLTAREN) 1 % GEL Apply topically 4 (four) times daily.     furosemide (LASIX) 20 MG tablet Take 20 mg by mouth daily as needed for fluid.      glucosamine-chondroitin 500-400 MG tablet Take 1 tablet by mouth 3 (three) times daily.     loratadine (CLARITIN) 10 MG tablet Take 10 mg by mouth daily.     vitamin E 400 UNIT capsule Take 800 Units by mouth daily. (Patient not taking: Reported on 07/13/2022)     No current facility-administered medications for this encounter.    REVIEW OF SYSTEMS: As above in HPI.   PHYSICAL EXAM:  vitals were not taken for this visit.     LABORATORY DATA:  Lab Results  Component Value Date   WBC 6.4 06/15/2016   HGB 14.1 06/15/2016   HCT 41.9 06/15/2016   MCV 90.5 06/15/2016   PLT 187 06/15/2016   CMP     Component Value Date/Time   NA 138 06/15/2016 0953   K 3.8 06/15/2016 0953   CL 106 06/15/2016 0953   CO2 24 06/15/2016 0953   GLUCOSE 108 (H) 06/15/2016 0953   BUN 14 06/15/2016 0953   CREATININE 0.68 06/15/2016 0953   CALCIUM 9.3 06/15/2016 0953   PROT 6.8 06/15/2016 0953   ALBUMIN 3.8 06/15/2016 0953   AST 19 06/15/2016 0953   ALT 17 06/15/2016 0953   ALKPHOS 55 06/15/2016 0953   BILITOT 0.6 06/15/2016 0953   GFRNONAA >60 06/15/2016 0953   GFRAA >60 06/15/2016 0953         RADIOGRAPHY: MM RT BREAST BX W LOC DEV 1ST LESION IMAGE BX SPEC STEREO GUIDE  Addendum Date: 07/03/2022   ADDENDUM REPORT: 07/03/2022 08:23 ADDENDUM: Pathology revealed DUCTAL CARCINOMA IN SITU, CRIBRIFORM, INTERMEDIATE GRADE. NECROSIS: PRESENT. CALCIFICATIONS:PRESENT of the RIGHT breast (coil clip). This was found to be concordant by Dr. Franki Cabot. Pathology results were discussed with the patient by telephone. The patient reported doing well after the biopsy with tenderness at the  site. Post biopsy instructions and care were reviewed and questions were answered. The patient was encouraged to call [facility] for any additional concerns. Surgical consultation has been arranged with Dr. Erroll Luna at [facility] on July 02, 2022. Note: Given diagnosis, will needed bracketed localization. There are additional calcifications seen extending approximately 2.7 cm anteriorly towards the nipple from today's biopsy site. Since today's biopsy reveals a malignancy requiring surgical excision, recommend bracketed localization of these more anterior calcifications as well as the most posterior-lateral component of the calcifications which are approximately 2.2 cm away from the biopsy clip. Pathology results reported by Stacie Acres RN on 07/02/2022. Electronically Signed   By: Franki Cabot M.D.   On: 07/03/2022 08:23   Result Date: 07/03/2022 CLINICAL DATA:  Patient presents today for stereotactic biopsy of indeterminate calcifications in the outer posterior RIGHT breast. EXAM: RIGHT BREAST STEREOTACTIC CORE NEEDLE BIOPSY COMPARISON:  Previous exam(s). FINDINGS: The patient and  I discussed the procedure of stereotactic-guided biopsy including benefits and alternatives. We discussed the high likelihood of a successful procedure. We discussed the risks of the procedure including infection, bleeding, tissue injury, clip migration, and inadequate sampling. Informed written consent was given. The usual time out protocol was performed immediately prior to the procedure. Using sterile technique and 1% Lidocaine as local anesthetic, under stereotactic guidance, a 9 gauge vacuum assisted device was used to perform core needle biopsy of calcifications in the outer posterior RIGHT breast using a superior approach. Specimen radiograph was performed showing calcifications. Specimens with calcifications are identified for pathology. Lesion quadrant: Outer posterior At  the conclusion of the procedure, a coil shaped tissue marker clip was deployed into the biopsy cavity. Follow-up 2-view mammogram was performed and dictated separately. IMPRESSION: 1. Stereotactic-guided biopsy of indeterminate calcifications within the outer posterior RIGHT breast. No apparent complications. 2. On today's preprocedure imaging, there are additional calcifications seen extending approximately 2.7 cm anteriorly towards the nipple from today's biopsy site. If today's biopsy reveals a malignancy or high risk lesion requiring surgical excision, recommend bracketed localization of these more anterior calcifications as well as the most posterior-lateral component of the calcifications. Electronically Signed: By: Franki Cabot M.D. On: 06/28/2022 10:28  MM CLIP PLACEMENT RIGHT  Result Date: 06/28/2022 CLINICAL DATA:  Status post stereotactic biopsy for RIGHT breast calcifications. EXAM: 3D DIAGNOSTIC RIGHT MAMMOGRAM POST STEREOTACTIC BIOPSY COMPARISON:  Previous exam(s). FINDINGS: 3D Mammographic images were obtained following stereotactic guided biopsy of indeterminate calcifications within the outer posterior RIGHT breast. The biopsy marking clip is in expected position at the site of biopsy. IMPRESSION: Appropriate positioning of the coil shaped biopsy marking clip at the site of biopsy in the outer posterior RIGHT breast. Final Assessment: Post Procedure Mammograms for Marker Placement Electronically Signed   By: Franki Cabot M.D.   On: 06/28/2022 10:34  MM DIAG BREAST TOMO UNI RIGHT  Result Date: 06/15/2022 CLINICAL DATA:  76 year old female presenting for short-term follow-up of probably benign right breast calcifications. EXAM: DIGITAL DIAGNOSTIC UNILATERAL RIGHT MAMMOGRAM WITH TOMOSYNTHESIS TECHNIQUE: Right digital diagnostic mammography and breast tomosynthesis was performed. COMPARISON:  Previous exam(s). ACR Breast Density Category b: There are scattered areas of fibroglandular density.  FINDINGS: Spot 2D magnification views of the right breast were performed in addition to standard views. There are grouped calcifications in the outer right breast spanning 2.7 cm which appear coarse heterogeneous with linear and branching distribution. No associated mass or distortion. IMPRESSION: Suspicious calcifications spanning 2.7 cm in the outer right breast. RECOMMENDATION: Stereotactic core needle biopsy x1 of the right breast. I have discussed the findings and recommendations with the patient who agrees to proceed with biopsy. The patient will be scheduled for the biopsy appointment prior to leaving the office today. BI-RADS CATEGORY  4: Suspicious. Electronically Signed   By: Audie Pinto M.D.   On: 06/15/2022 11:32     IMPRESSION/PLAN: This is a very pleasant 76 year old woman with right breast DCIS, ER negative.  Anticipating breast conserving surgery in the next week.  It was a pleasure meeting the patient today. We discussed the risks, benefits, and side effects of radiotherapy. I recommend radiotherapy to the right breast to minimize the risk of a recurrence in her breast.  We discussed that radiation would take approximately 4 weeks to complete and that I would give the patient a few weeks to heal following surgery before starting treatment planning.   We spoke about acute effects including skin irritation and breast  tenderness as well as much less common late effects including internal organ injury or irritation.  Fatigue is possible but would not be likely to keep her from driving here.  She anticipates her husband will drive her to most appointments.  We spoke about the latest technology that is used to minimize the risk of late effects for patients undergoing radiotherapy to the breast or chest wall. No guarantees of treatment were given. The patient is enthusiastic about proceeding with treatment. I look forward to participating in the patient's care.  I will await her referral back to  me for postoperative follow-up and eventual CT simulation/treatment planning.  I offered her referral for genetic testing given her family history.  We talked about the pros and cons of genetic testing.  She is not interested in that at this time but will let us know if she changes her mind.  This encounter was provided by telemedicine platform; patient desired telemedicine during pandemic precautions.  MyChart video was not available and therefore telephone was used. The patient has given verbal consent for this type of encounter and has been advised to only accept a meeting of this type in a secure network environment. On date of service, in total, I spent 35 minutes on this encounter.   The attendants for this meeting include Eppie Gibson  and Vergia Alcon During the encounter, Eppie Gibson was located at [facility] Radiation Oncology Department.  VERENISE MOULIN was located at home.     __________________________________________   Eppie Gibson, MD  This document serves as a record of services personally performed by Eppie Gibson, MD. It was created on her behalf by Roney Mans, a trained medical scribe. The creation of this record is based on the scribe's personal observations and the provider's statements to them. This document has been checked and approved by the attending provider.

## 2022-07-13 ENCOUNTER — Encounter: Payer: Self-pay | Admitting: Radiation Oncology

## 2022-07-13 ENCOUNTER — Ambulatory Visit
Admission: RE | Admit: 2022-07-13 | Discharge: 2022-07-13 | Disposition: A | Discharge status: Home / Self Care | Payer: Medicare Other | Source: Ambulatory Visit | Attending: Surgery | Admitting: Surgery

## 2022-07-13 ENCOUNTER — Ambulatory Visit
Admission: RE | Admit: 2022-07-13 | Discharge: 2022-07-13 | Disposition: A | Discharge status: Home / Self Care | Payer: Medicare Other | Source: Ambulatory Visit | Attending: Radiation Oncology | Admitting: Radiation Oncology

## 2022-07-13 ENCOUNTER — Encounter (HOSPITAL_BASED_OUTPATIENT_CLINIC_OR_DEPARTMENT_OTHER)
Admission: RE | Admit: 2022-07-13 | Discharge: 2022-07-13 | Disposition: A | Discharge status: Home / Self Care | Payer: Medicare Other | Source: Ambulatory Visit | Attending: Physician Assistant | Admitting: Physician Assistant

## 2022-07-13 ENCOUNTER — Ambulatory Visit: Payer: Medicare Other

## 2022-07-13 DIAGNOSIS — D0511 Intraductal carcinoma in situ of right breast: Secondary | ICD-10-CM

## 2022-07-13 DIAGNOSIS — Z01812 Encounter for preprocedural laboratory examination: Secondary | ICD-10-CM | POA: Diagnosis not present

## 2022-07-13 DIAGNOSIS — Z171 Estrogen receptor negative status [ER-]: Secondary | ICD-10-CM | POA: Diagnosis not present

## 2022-07-13 DIAGNOSIS — M25473 Effusion, unspecified ankle: Secondary | ICD-10-CM | POA: Insufficient documentation

## 2022-07-13 LAB — BASIC METABOLIC PANEL
Anion gap: 7 (ref 5–15)
BUN: 14 mg/dL (ref 8–23)
CO2: 24 mmol/L (ref 22–32)
Calcium: 9.4 mg/dL (ref 8.9–10.3)
Chloride: 104 mmol/L (ref 98–111)
Creatinine, Ser: 0.83 mg/dL (ref 0.44–1.00)
GFR, Estimated: >60 mL/min (ref >60)
Glucose, Bld: 96 mg/dL (ref 70–99)
Potassium: 5.2 mmol/L — ABNORMAL HIGH (ref 3.5–5.1)
Sodium: 135 mmol/L (ref 135–145)

## 2022-07-13 NOTE — Progress Notes (Signed)
Location of Breast Cancer:  Ductal carcinoma in situ (DCIS) of right breast     Histology per Pathology Report:  (Definitive pathology pending upcoming su 06/28/2022 Breast, right, needle core biopsy - DUCTAL CARCINOMA IN SITU, CRIBRIFORM, INTERMEDIATE GRADE - NECROSIS: PRESENT - CALCIFICATIONS: PRESENT - DCIS LENGTH: 0.4 CM Receptor Status: ER(0%), PR (0%),   Did patient present with symptoms (if so, please note symptoms) or was this found on screening mammography?: (from Dr. Josetta Huddle office note): "Patient presents for evaluation of abnormal mammogram. She was noted to have a 2.7 cm with calcifications right breast upper inner quadrant"   Past/Anticipated interventions by surgeon, if any:  07/17/2022 --Dr. Marcello Moores Cornett  Scheduled for: RIGHT BREAST LUMPECTOMY WITH RADIOACTIVE SEED LOCALIZATION   Past/Anticipated interventions by medical oncology, if any:  Scheduled for consultation with Dr. Benay Pike on 07/31/2022   Lymphedema issues, if any:  None     Pain issues, if any:  Denies    SAFETY ISSUES: Prior radiation? No Pacemaker/ICD? No Possible current pregnancy? No--hysterectomy Is the patient on methotrexate? No   Current Complaints / other details:  Reports she had a lump removed from her left breast when she was 76 years old (states it was benign and no additional treatment was recommended).

## 2022-07-13 NOTE — Progress Notes (Signed)

## 2022-07-16 NOTE — Progress Notes (Signed)
K+ 5.2, Dr. Tobias Alexander aware, will proceed with surgery as scheduled.

## 2022-07-17 ENCOUNTER — Other Ambulatory Visit: Payer: Self-pay

## 2022-07-17 ENCOUNTER — Encounter (HOSPITAL_BASED_OUTPATIENT_CLINIC_OR_DEPARTMENT_OTHER)
Admission: RE | Disposition: A | Discharge status: Home / Self Care | Payer: Self-pay | Source: Home / Self Care | Attending: Surgery

## 2022-07-17 ENCOUNTER — Ambulatory Visit (HOSPITAL_BASED_OUTPATIENT_CLINIC_OR_DEPARTMENT_OTHER)
Admission: RE | Admit: 2022-07-17 | Discharge: 2022-07-17 | Disposition: A | Discharge status: Home / Self Care | Payer: Medicare Other | Attending: Surgery | Admitting: Surgery

## 2022-07-17 ENCOUNTER — Ambulatory Visit (HOSPITAL_BASED_OUTPATIENT_CLINIC_OR_DEPARTMENT_OTHER): Payer: Medicare Other | Admitting: Certified Registered"

## 2022-07-17 ENCOUNTER — Encounter (HOSPITAL_BASED_OUTPATIENT_CLINIC_OR_DEPARTMENT_OTHER): Payer: Self-pay | Admitting: Surgery

## 2022-07-17 ENCOUNTER — Ambulatory Visit
Admission: RE | Admit: 2022-07-17 | Discharge: 2022-07-17 | Disposition: A | Discharge status: Home / Self Care | Payer: Medicare Other | Source: Ambulatory Visit | Attending: Surgery | Admitting: Surgery

## 2022-07-17 DIAGNOSIS — M25473 Effusion, unspecified ankle: Secondary | ICD-10-CM

## 2022-07-17 DIAGNOSIS — Z170 Estrogen receptor positive status [ER+]: Secondary | ICD-10-CM

## 2022-07-17 DIAGNOSIS — Z79890 Hormone replacement therapy (postmenopausal): Secondary | ICD-10-CM | POA: Diagnosis not present

## 2022-07-17 DIAGNOSIS — C50511 Malignant neoplasm of lower-outer quadrant of right female breast: Secondary | ICD-10-CM | POA: Diagnosis not present

## 2022-07-17 DIAGNOSIS — E079 Disorder of thyroid, unspecified: Secondary | ICD-10-CM | POA: Diagnosis not present

## 2022-07-17 DIAGNOSIS — D0511 Intraductal carcinoma in situ of right breast: Secondary | ICD-10-CM

## 2022-07-17 DIAGNOSIS — E119 Type 2 diabetes mellitus without complications: Secondary | ICD-10-CM | POA: Insufficient documentation

## 2022-07-17 DIAGNOSIS — Z79899 Other long term (current) drug therapy: Secondary | ICD-10-CM | POA: Diagnosis not present

## 2022-07-17 DIAGNOSIS — N641 Fat necrosis of breast: Secondary | ICD-10-CM | POA: Diagnosis not present

## 2022-07-17 DIAGNOSIS — K219 Gastro-esophageal reflux disease without esophagitis: Secondary | ICD-10-CM | POA: Diagnosis not present

## 2022-07-17 DIAGNOSIS — R921 Mammographic calcification found on diagnostic imaging of breast: Secondary | ICD-10-CM | POA: Diagnosis not present

## 2022-07-17 DIAGNOSIS — N6011 Diffuse cystic mastopathy of right breast: Secondary | ICD-10-CM | POA: Insufficient documentation

## 2022-07-17 DIAGNOSIS — R928 Other abnormal and inconclusive findings on diagnostic imaging of breast: Secondary | ICD-10-CM | POA: Diagnosis not present

## 2022-07-17 DIAGNOSIS — E039 Hypothyroidism, unspecified: Secondary | ICD-10-CM | POA: Insufficient documentation

## 2022-07-17 HISTORY — DX: Hypothyroidism, unspecified: E03.9

## 2022-07-17 HISTORY — DX: Allergy, unspecified, initial encounter: T78.40XA

## 2022-07-17 HISTORY — PX: BREAST LUMPECTOMY WITH RADIOACTIVE SEED LOCALIZATION: SHX6424

## 2022-07-17 SURGERY — BREAST LUMPECTOMY WITH RADIOACTIVE SEED LOCALIZATION
Anesthesia: General | Site: Breast | Laterality: Right

## 2022-07-17 MED ORDER — OXYCODONE HCL 5 MG PO TABS: 5.0000 mg | ORAL_TABLET | Freq: Once | ORAL | Status: DC | PRN

## 2022-07-17 MED ORDER — PROPOFOL 10 MG/ML IV BOLUS
INTRAVENOUS | Status: DC | PRN
  Administered 2022-07-17: 200 mg via INTRAVENOUS

## 2022-07-17 MED ORDER — FENTANYL CITRATE (PF) 100 MCG/2ML IJ SOLN: 25.0000 ug | INTRAMUSCULAR | Status: DC | PRN

## 2022-07-17 MED ORDER — FENTANYL CITRATE (PF) 100 MCG/2ML IJ SOLN
INTRAMUSCULAR | Status: AC
  Filled 2022-07-17: qty 2

## 2022-07-17 MED ORDER — CHLORHEXIDINE GLUCONATE CLOTH 2 % EX PADS: 6.0000 | MEDICATED_PAD | Freq: Once | CUTANEOUS | Status: DC

## 2022-07-17 MED ORDER — BUPIVACAINE-EPINEPHRINE (PF) 0.25% -1:200000 IJ SOLN
INTRAMUSCULAR | Status: DC | PRN
  Administered 2022-07-17: 18 mL via PERINEURAL

## 2022-07-17 MED ORDER — FENTANYL CITRATE (PF) 100 MCG/2ML IJ SOLN
INTRAMUSCULAR | Status: DC | PRN
  Administered 2022-07-17: 50 ug via INTRAVENOUS
  Administered 2022-07-17 (×2): 25 ug via INTRAVENOUS

## 2022-07-17 MED ORDER — DEXAMETHASONE SODIUM PHOSPHATE 10 MG/ML IJ SOLN
INTRAMUSCULAR | Status: DC | PRN
  Administered 2022-07-17: 10 mg via INTRAVENOUS

## 2022-07-17 MED ORDER — ACETAMINOPHEN 500 MG PO TABS
ORAL_TABLET | ORAL | Status: AC
  Filled 2022-07-17: qty 2

## 2022-07-17 MED ORDER — ONDANSETRON HCL 4 MG/2ML IJ SOLN
INTRAMUSCULAR | Status: DC | PRN
  Administered 2022-07-17: 4 mg via INTRAVENOUS

## 2022-07-17 MED ORDER — LACTATED RINGERS IV SOLN: INTRAVENOUS | Status: DC | PRN

## 2022-07-17 MED ORDER — LIDOCAINE HCL (CARDIAC) PF 100 MG/5ML IV SOSY
PREFILLED_SYRINGE | INTRAVENOUS | Status: DC | PRN
  Administered 2022-07-17: 100 mg via INTRAVENOUS

## 2022-07-17 MED ORDER — CEFAZOLIN SODIUM-DEXTROSE 2-3 GM-%(50ML) IV SOLR
INTRAVENOUS | Status: DC | PRN
  Administered 2022-07-17: 2 g via INTRAVENOUS

## 2022-07-17 MED ORDER — SODIUM CHLORIDE 0.9 % IV SOLN
INTRAVENOUS | Status: AC
  Filled 2022-07-17: qty 10

## 2022-07-17 MED ORDER — LACTATED RINGERS IV SOLN: INTRAVENOUS | Status: DC

## 2022-07-17 MED ORDER — ONDANSETRON HCL 4 MG/2ML IJ SOLN: 4.0000 mg | Freq: Once | INTRAMUSCULAR | Status: DC | PRN

## 2022-07-17 MED ORDER — CEFAZOLIN SODIUM-DEXTROSE 2-4 GM/100ML-% IV SOLN
INTRAVENOUS | Status: AC
  Filled 2022-07-17: qty 100

## 2022-07-17 MED ORDER — ONDANSETRON HCL 4 MG/2ML IJ SOLN
INTRAMUSCULAR | Status: AC
  Filled 2022-07-17: qty 2

## 2022-07-17 MED ORDER — PROPOFOL 500 MG/50ML IV EMUL
INTRAVENOUS | Status: DC | PRN
  Administered 2022-07-17: 25 ug/kg/min via INTRAVENOUS

## 2022-07-17 MED ORDER — SODIUM CHLORIDE 0.9 % IV SOLN
INTRAVENOUS | Status: DC | PRN
  Administered 2022-07-17: 500 mL

## 2022-07-17 MED ORDER — OXYCODONE HCL 5 MG/5ML PO SOLN: 5.0000 mg | Freq: Once | ORAL | Status: DC | PRN

## 2022-07-17 MED ORDER — CEFAZOLIN SODIUM-DEXTROSE 2-4 GM/100ML-% IV SOLN: 2.0000 g | INTRAVENOUS | Status: DC

## 2022-07-17 MED ORDER — LIDOCAINE 2% (20 MG/ML) 5 ML SYRINGE
INTRAMUSCULAR | Status: AC
  Filled 2022-07-17: qty 5

## 2022-07-17 MED ORDER — CEFAZOLIN IN SODIUM CHLORIDE 3-0.9 GM/100ML-% IV SOLN: 3.0000 g | INTRAVENOUS | Status: DC

## 2022-07-17 MED ORDER — EPHEDRINE SULFATE (PRESSORS) 50 MG/ML IJ SOLN
INTRAMUSCULAR | Status: DC | PRN
  Administered 2022-07-17: 15 mg via INTRAVENOUS
  Administered 2022-07-17: 10 mg via INTRAVENOUS
  Administered 2022-07-17: 15 mg via INTRAVENOUS

## 2022-07-17 MED ORDER — ACETAMINOPHEN 500 MG PO TABS
1000.0000 mg | ORAL_TABLET | ORAL | Status: AC
  Administered 2022-07-17: 1000 mg via ORAL

## 2022-07-17 MED ORDER — DEXAMETHASONE SODIUM PHOSPHATE 10 MG/ML IJ SOLN
INTRAMUSCULAR | Status: AC
  Filled 2022-07-17: qty 1

## 2022-07-17 MED ORDER — ACETAMINOPHEN 325 MG PO TABS: 325.0000 mg | ORAL_TABLET | ORAL | Status: DC | PRN

## 2022-07-17 MED ORDER — OXYCODONE HCL 5 MG PO TABS: 5.0000 mg | ORAL_TABLET | Freq: Four times a day (QID) | ORAL | 0 refills | Status: DC | PRN

## 2022-07-17 MED ORDER — ACETAMINOPHEN 160 MG/5ML PO SOLN: 325.0000 mg | ORAL | Status: DC | PRN

## 2022-07-17 SURGICAL SUPPLY — 49 items
ADH SKN CLS APL DERMABOND .7 (GAUZE/BANDAGES/DRESSINGS) ×1
APL PRP STRL LF DISP 70% ISPRP (MISCELLANEOUS) ×1
APPLIER CLIP 9.375 MED OPEN (MISCELLANEOUS) ×1
APR CLP MED 9.3 20 MLT OPN (MISCELLANEOUS) ×1
BINDER BREAST LRG (GAUZE/BANDAGES/DRESSINGS) IMPLANT
BINDER BREAST MEDIUM (GAUZE/BANDAGES/DRESSINGS) IMPLANT
BINDER BREAST XLRG (GAUZE/BANDAGES/DRESSINGS) IMPLANT
BINDER BREAST XXLRG (GAUZE/BANDAGES/DRESSINGS) IMPLANT
BLADE SURG 15 STRL LF DISP TIS (BLADE) ×1 IMPLANT
BLADE SURG 15 STRL SS (BLADE) ×1
CANISTER SUC SOCK COL 7IN (MISCELLANEOUS) IMPLANT
CANISTER SUCT 1200ML W/VALVE (MISCELLANEOUS) IMPLANT
CHLORAPREP W/TINT 26 (MISCELLANEOUS) ×1 IMPLANT
CLIP APPLIE 9.375 MED OPEN (MISCELLANEOUS) IMPLANT
COVER BACK TABLE 60X90IN (DRAPES) ×1 IMPLANT
COVER MAYO STAND STRL (DRAPES) ×1 IMPLANT
COVER PROBE W GEL 5X96 (DRAPES) ×1 IMPLANT
DERMABOND ADVANCED .7 DNX12 (GAUZE/BANDAGES/DRESSINGS) ×1 IMPLANT
DRAPE LAPAROSCOPIC ABDOMINAL (DRAPES) IMPLANT
DRAPE LAPAROTOMY 100X72 PEDS (DRAPES) ×1 IMPLANT
DRAPE UTILITY XL STRL (DRAPES) ×1 IMPLANT
ELECT COATED BLADE 2.86 ST (ELECTRODE) ×1 IMPLANT
ELECT REM PT RETURN 9FT ADLT (ELECTROSURGICAL) ×1
ELECTRODE REM PT RTRN 9FT ADLT (ELECTROSURGICAL) ×1 IMPLANT
GLOVE BIOGEL PI IND STRL 8 (GLOVE) ×1 IMPLANT
GLOVE ECLIPSE 8.0 STRL XLNG CF (GLOVE) ×1 IMPLANT
GOWN STRL REUS W/ TWL LRG LVL3 (GOWN DISPOSABLE) ×2 IMPLANT
GOWN STRL REUS W/ TWL XL LVL3 (GOWN DISPOSABLE) ×1 IMPLANT
GOWN STRL REUS W/TWL LRG LVL3 (GOWN DISPOSABLE) ×2
GOWN STRL REUS W/TWL XL LVL3 (GOWN DISPOSABLE) ×1
HEMOSTAT ARISTA ABSORB 3G PWDR (HEMOSTASIS) IMPLANT
HEMOSTAT SNOW SURGICEL 2X4 (HEMOSTASIS) IMPLANT
KIT MARKER MARGIN INK (KITS) ×1 IMPLANT
NDL HYPO 25X1 1.5 SAFETY (NEEDLE) ×1 IMPLANT
NEEDLE HYPO 25X1 1.5 SAFETY (NEEDLE) ×1 IMPLANT
NS IRRIG 1000ML POUR BTL (IV SOLUTION) ×1 IMPLANT
PACK BASIN DAY SURGERY FS (CUSTOM PROCEDURE TRAY) ×1 IMPLANT
PENCIL SMOKE EVACUATOR (MISCELLANEOUS) ×1 IMPLANT
SLEEVE SCD COMPRESS KNEE MED (STOCKING) ×1 IMPLANT
SPIKE FLUID TRANSFER (MISCELLANEOUS) IMPLANT
SPONGE T-LAP 4X18 ~~LOC~~+RFID (SPONGE) ×1 IMPLANT
SUT MNCRL AB 4-0 PS2 18 (SUTURE) ×1 IMPLANT
SUT SILK 2 0 SH (SUTURE) IMPLANT
SUT VICRYL 3-0 CR8 SH (SUTURE) ×1 IMPLANT
SYR CONTROL 10ML LL (SYRINGE) ×1 IMPLANT
TOWEL GREEN STERILE FF (TOWEL DISPOSABLE) ×1 IMPLANT
TRAY FAXITRON CT DISP (TRAY / TRAY PROCEDURE) ×1 IMPLANT
TUBE CONNECTING 20X1/4 (TUBING) IMPLANT
YANKAUER SUCT BULB TIP NO VENT (SUCTIONS) IMPLANT

## 2022-07-17 NOTE — Op Note (Signed)
Preoperative diagnosis: Right breast cancer lower outer quadrant stage I ER positive  Postoperative diagnosis: Same  Procedure: Right breast seed localized lumpectomy  Surgeon: Thomas Cornett, MD  Anesthesia: LMA with 0.25% Marcaine with epinephrine  EBL: 70 cc  Assistant: Dr. Gao MD I was personally present during the key and critical portions of this procedure and immediately available throughout the entire procedure, as documented in my operative note.   IV fluids: Per anesthesia record    specimen: Right breast tissue with seed and clip verified by Faxitron   Indications of procedure: Patient presents for breast conserving surgery for stage I right breast cancer.  She was seen in the multidisciplinary clinic and counseled.  She opted for lumpectomy alone after discussion the pros and cons of sentinel lymph node mapping for her biology and stage of disease.The procedure has been discussed with the patient. Alternatives to surgery have been discussed with the patient.  Risks of surgery include bleeding,  Infection,  Seroma formation, death,  and the need for further surgery.   The patient understands and wishes to proceed.    Description of procedure: The patient was met in the holding area and questions were answered.  Right breast was marked as correct site and she had a seed placed into the right breast as an outpatient with the radiologist.  All questions were answered.  She was then taken back to the operating room and placed supine on the operating room table.  After induction of general anesthesia, right breast was prepped and draped in sterile fashion and timeout performed.  Proper patient, site and procedure were verified.  Neoprobe was used to localize seed right breast upper outer quadrant.  A curvilinear incision was made over the area.  Dissection was carried down and all tissue around the seed and clip were excised with a grossly negative margin.  Inspection of the cavity  showed good hemostasis.  There was some bleeding vessels that we oversewed with 3-0 Vicryl.  We irrigated.  Hemostasis was then excellent.  Clips were placed to mark the cavity.  The Faxitron image revealed the seed and clip to be in the specimen and the margins appeared grossly uninvolved.  This of note was oriented with ink and sent to pathology for further evaluation.  After reinspection and ensuring hemostasis, the wound was closed with a deep layer 3-0 Vicryl.  4 Monocryl used to close skin in a subcuticular fashion.  Dermabond was applied.  All counts were found to be correct.  The patient was awoke extubated taken to recovery in satisfactory condition. 

## 2022-07-17 NOTE — Discharge Instructions (Addendum)
[facility] Office Phone Number [phone]  BREAST BIOPSY/ PARTIAL MASTECTOMY: POST OP INSTRUCTIONS  Always review your discharge instruction sheet given to you by the facility where your surgery was performed.  IF YOU HAVE DISABILITY OR FAMILY LEAVE FORMS, YOU MUST BRING THEM TO THE OFFICE FOR PROCESSING.  DO NOT GIVE THEM TO YOUR DOCTOR.  A prescription for pain medication may be given to you upon discharge.  Take your pain medication as prescribed, if needed.  If narcotic pain medicine is not needed, then you may take acetaminophen (Tylenol) or ibuprofen (Advil) as needed. Take your usually prescribed medications unless otherwise directed If you need a refill on your pain medication, please contact your pharmacy.  They will contact our office to request authorization.  Prescriptions will not be filled after 5pm or on week-ends. You should eat very light the first 24 hours after surgery, such as soup, crackers, pudding, etc.  Resume your normal diet the day after surgery. Most patients will experience some swelling and bruising in the breast.  Ice packs and a good support bra will help.  Swelling and bruising can take several days to resolve.  It is common to experience some constipation if taking pain medication after surgery.  Increasing fluid intake and taking a stool softener will usually help or prevent this problem from occurring.  A mild laxative (Milk of Magnesia or Miralax) should be taken according to package directions if there are no bowel movements after 48 hours. Unless discharge instructions indicate otherwise, you may remove your bandages 24-48 hours after surgery, and you may shower at that time.  You may have steri-strips (small skin tapes) in place directly over the incision.  These strips should be left on the skin for 7-10 days.  If your surgeon used skin glue on the incision, you may shower in 24 hours.  The glue will flake off over the next 2-3 weeks.  Any  sutures or staples will be removed at the office during your follow-up visit. ACTIVITIES:  You may resume regular daily activities (gradually increasing) beginning the next day.  Wearing a good support bra or sports bra minimizes pain and swelling.  You may have sexual intercourse when it is comfortable. You may drive when you no longer are taking prescription pain medication, you can comfortably wear a seatbelt, and you can safely maneuver your car and apply brakes. RETURN TO WORK:  ______________________________________________________________________________________ You should see your doctor in the office for a follow-up appointment approximately two weeks after your surgery.  Your doctor's nurse will typically make your follow-up appointment when she calls you with your pathology report.  Expect your pathology report 2-3 business days after your surgery.  You may call to check if you do not hear from us after three days. OTHER INSTRUCTIONS: _______________________________________________________________________________________________ _____________________________________________________________________________________________________________________________________ _____________________________________________________________________________________________________________________________________ _____________________________________________________________________________________________________________________________________  WHEN TO CALL YOUR DOCTOR: Fever over 101.0 Nausea and/or vomiting. Extreme swelling or bruising. Continued bleeding from incision. Increased pain, redness, or drainage from the incision.  The clinic staff is available to answer your questions during regular business hours.  Please don't hesitate to call and ask to speak to one of the nurses for clinical concerns.  If you have a medical emergency, go to the nearest emergency room or call 911.  A surgeon from [facility] is always on call at the hospital.  For further questions, please visit [email or web address]    Post Anesthesia Home Care Instructions  Activity: Get plenty of rest for the remainder of   of the day. A responsible individual must stay with you for 24 hours following the procedure.  For the next 24 hours, DO NOT: -Drive a car -Paediatric nurse -Drink alcoholic beverages -Take any medication unless instructed by your physician -Make any legal decisions or sign important papers.  Meals: Start with liquid foods such as gelatin or soup. Progress to regular foods as tolerated. Avoid greasy, spicy, heavy foods. If nausea and/or vomiting occur, drink only clear liquids until the nausea and/or vomiting subsides. Call your physician if vomiting continues.  Special Instructions/Symptoms: Your throat may feel dry or sore from the anesthesia or the breathing tube placed in your throat during surgery. If this causes discomfort, gargle with warm salt water. The discomfort should disappear within 24 hours.  If you had a scopolamine patch placed behind your ear for the management of post- operative nausea and/or vomiting:  1. The medication in the patch is effective for 72 hours, after which it should be removed.  Wrap patch in a tissue and discard in the trash. Wash hands thoroughly with soap and water. 2. You may remove the patch earlier than 72 hours if you experience unpleasant side effects which may include dry mouth, dizziness or visual disturbances. 3. Avoid touching the patch. Wash your hands with soap and water after contact with the patch.     Post Anesthesia Home Care Instructions  Activity: Get plenty of rest for the remainder of the day. A responsible individual must stay with you for 24 hours following the procedure.  For the next 24 hours, DO NOT: -Drive a car -Paediatric nurse -Drink alcoholic beverages -Take any medication unless instructed by your  physician -Make any legal decisions or sign important papers.  Meals: Start with liquid foods such as gelatin or soup. Progress to regular foods as tolerated. Avoid greasy, spicy, heavy foods. If nausea and/or vomiting occur, drink only clear liquids until the nausea and/or vomiting subsides. Call your physician if vomiting continues.  Special Instructions/Symptoms: Your throat may feel dry or sore from the anesthesia or the breathing tube placed in your throat during surgery. If this causes discomfort, gargle with warm salt water. The discomfort should disappear within 24 hours.  If you had a scopolamine patch placed behind your ear for the management of post- operative nausea and/or vomiting:  1. The medication in the patch is effective for 72 hours, after which it should be removed.  Wrap patch in a tissue and discard in the trash. Wash hands thoroughly with soap and water. 2. You may remove the patch earlier than 72 hours if you experience unpleasant side effects which may include dry mouth, dizziness or visual disturbances. 3. Avoid touching the patch. Wash your hands with soap and water after contact with the patch.    Post Anesthesia Home Care Instructions  Activity: Get plenty of rest for the remainder of the day. A responsible individual must stay with you for 24 hours following the procedure.  For the next 24 hours, DO NOT: -Drive a car -Paediatric nurse -Drink alcoholic beverages -Take any medication unless instructed by your physician -Make any legal decisions or sign important papers.  Meals: Start with liquid foods such as gelatin or soup. Progress to regular foods as tolerated. Avoid greasy, spicy, heavy foods. If nausea and/or vomiting occur, drink only clear liquids until the nausea and/or vomiting subsides. Call your physician if vomiting continues.  Special Instructions/Symptoms: Your throat may feel dry or sore from the anesthesia or the breathing tube  placed in  your throat during surgery. If this causes discomfort, gargle with warm salt water. The discomfort should disappear within 24 hours.  If you had a scopolamine patch placed behind your ear for the management of post- operative nausea and/or vomiting:  1. The medication in the patch is effective for 72 hours, after which it should be removed.  Wrap patch in a tissue and discard in the trash. Wash hands thoroughly with soap and water. 2. You may remove the patch earlier than 72 hours if you experience unpleasant side effects which may include dry mouth, dizziness or visual disturbances. 3. Avoid touching the patch. Wash your hands with soap and water after contact with the patch.

## 2022-07-17 NOTE — Anesthesia Preprocedure Evaluation (Signed)
Anesthesia Evaluation  Patient identified by MRN, date of birth, ID band Patient awake    Reviewed: Allergy & Precautions, NPO status , Patient's Chart, lab work & pertinent test results  Airway Mallampati: I       Dental no notable dental hx. (+) Teeth Intact   Pulmonary neg pulmonary ROS,    Pulmonary exam normal        Cardiovascular negative cardio ROS Normal cardiovascular exam     Neuro/Psych negative neurological ROS  negative psych ROS   GI/Hepatic Neg liver ROS, GERD  Medicated,  Endo/Other  Hypothyroidism   Renal/GU negative Renal ROS  negative genitourinary   Musculoskeletal   Abdominal Normal abdominal exam  (+)   Peds  Hematology   Anesthesia Other Findings   Reproductive/Obstetrics                             Anesthesia Physical  Anesthesia Plan  ASA: II  Anesthesia Plan: General   Post-op Pain Management: Minimal or no pain anticipated   Induction: Intravenous  PONV Risk Score and Plan: 2 and Ondansetron  Airway Management Planned: LMA  Additional Equipment: None  Intra-op Plan:   Post-operative Plan: Extubation in OR  Informed Consent: I have reviewed the patients History and Physical, chart, labs and discussed the procedure including the risks, benefits and alternatives for the proposed anesthesia with the patient or authorized representative who has indicated his/her understanding and acceptance.     Dental advisory given  Plan Discussed with: CRNA  Anesthesia Plan Comments:         Anesthesia Quick Evaluation

## 2022-07-17 NOTE — H&P (Signed)
Chief Complaint: New Consultation (Breast cancer)   History of Present Illness: Danielle Ritter is a 76 y.o. female who is seen today as an office consultation for evaluation of New Consultation (Breast cancer) .   Patient presents for evaluation of abnormal mammogram. She was noted to have a 2.7 cm with calcifications right breast upper inner quadrant. Core biopsy showed high-grade DCIS. She has no complaints of breast pain, breast mass or nipple discharge. No family history of breast cancer.  Review of Systems: A complete review of systems was obtained from the patient. I have reviewed this information and discussed as appropriate with the patient. See HPI as well for other ROS.    Medical History: Past Medical History:  Diagnosis Date  Diabetes mellitus without complication (CMS-HCC)  Thyroid disease   There is no problem list on file for this patient.  Past Surgical History:  Procedure Laterality Date  LAPAROTOMY FOR REMOVAL TUMOR LUMBAR PLEXES 1960  CHOLECYSTECTOMY OPEN  2018  HYSTERECTOMY  nipple discharge Right  2010    Allergies  Allergen Reactions  Opioids-Methadone And Related Unknown  Codeine Nausea And Vomiting   Current Outpatient Medications on File Prior to Visit  Medication Sig Dispense Refill  cyanocobalamin (VITAMIN B12) 1000 MCG tablet Take 1,000 mcg by mouth once daily  glucosamine/chondr su A sod (GLUCOSAMINE-CHONDROITIN) 1,500-1,200 mg/30 mL Liqd Take by mouth  levothyroxine (SYNTHROID) 50 MCG tablet Take 50 mcg by mouth once daily  loratadine (CLARITIN) 10 mg tablet Take 10 mg by mouth  vitamin E 400 UNIT capsule Take by mouth   No current facility-administered medications on file prior to visit.   Family History  Problem Relation Age of Onset  Diabetes Mother  Diabetes Sister  Diabetes Brother    Social History   Tobacco Use  Smoking Status Never  Smokeless Tobacco Never    Social History   Socioeconomic History  Marital status:  Married  Tobacco Use  Smoking status: Never  Smokeless tobacco: Never  Substance and Sexual Activity  Alcohol use: Not Currently  Drug use: Never   Objective:   Vitals:  07/02/22 1014  BP: (!) 140/90  Pulse: 76  Temp: 36.7 C (98 F)  SpO2: 97%  Weight: 64.4 kg (142 lb)  Height: 152.4 cm (5')   Body mass index is 27.73 kg/m.  Physical Exam Constitutional:  Appearance: Normal appearance.  HENT:  Head: Normocephalic.  Eyes:  Pupils: Pupils are equal, round, and reactive to light.  Cardiovascular:  Rate and Rhythm: Normal rate.  Pulmonary:  Effort: Pulmonary effort is normal.  Chest:  Breasts: Right: Normal.  Left: Normal.  Musculoskeletal:  Cervical back: Normal range of motion.  Lymphadenopathy:  Upper Body:  Right upper body: No supraclavicular or axillary adenopathy.  Left upper body: No supraclavicular or axillary adenopathy.  Neurological:  Mental Status: She is alert.    Labs, Imaging and Diagnostic Testing:  Diagnosis Breast, right, needle core biopsy - DUCTAL CARCINOMA IN SITU, CRIBRIFORM, INTERMEDIATE GRADE - NECROSIS: PRESENT - CALCIFICATIONS: PRESENT - DCIS LENGTH: 0.4 CM  CLINICAL DATA: 76 year old female presenting for short-term follow-up of probably benign right breast calcifications.  EXAM: DIGITAL DIAGNOSTIC UNILATERAL RIGHT MAMMOGRAM WITH TOMOSYNTHESIS  TECHNIQUE: Right digital diagnostic mammography and breast tomosynthesis was performed.  COMPARISON: Previous exam(s).  ACR Breast Density Category b: There are scattered areas of fibroglandular density.  FINDINGS: Spot 2D magnification views of the right breast were performed in addition to standard views. There are grouped calcifications in the outer right breast  spanning 2.7 cm which appear coarse heterogeneous with linear and branching distribution. No associated mass or distortion.  IMPRESSION: Suspicious calcifications spanning 2.7 cm in the outer right  breast.  RECOMMENDATION: Stereotactic core needle biopsy x1 of the right breast.  I have discussed the findings and recommendations with the patient who agrees to proceed with biopsy. The patient will be scheduled for the biopsy appointment prior to leaving the office today.  BI-RADS CATEGORY 4: Suspicious.   Electronically Signed By: Audie Pinto M.D. On: 06/15/2022 11:32  Assessment and Plan:   Diagnoses and all orders for this visit:  Neoplasm of right breast, primary tumor staging category Tis: ductal carcinoma in situ (DCIS) - Ambulatory Referral to Oncology-Medical - Ambulatory Referral to Radiation Oncology  Discussed breast conserving surgery with mastectomy and reconstruction. Discussed adjuvant therapies of possible radiation therapy. Discussed the natural history of DCIS. Discussed long-term survival, local regional recurrence, and quality of life with both mastectomy as well as breast conserving surgery. She is opted for right breast seed localized lumpectomy.The procedure has been discussed with the patient. Alternatives to surgery have been discussed with the patient. Risks of surgery include bleeding, Infection, cosmesis, seroma formation, death, and the need for further surgery. The patient understands and wishes to proceed.   No follow-ups on file.  Kennieth Francois, MD

## 2022-07-17 NOTE — Transfer of Care (Signed)
Immediate Anesthesia Transfer of Care Note  Patient: Danielle Ritter  Procedure(s) Performed: RIGHT BREAST LUMPECTOMY WITH RADIOACTIVE SEED LOCALIZATION (Right: Breast)  Patient Location: PACU  Anesthesia Type:General  Level of Consciousness: awake, alert  and patient cooperative  Airway & Oxygen Therapy: Patient Spontanous Breathing and Patient connected to face mask oxygen  Post-op Assessment: Report given to RN and Post -op Vital signs reviewed and stable  Post vital signs: Reviewed and stable  Last Vitals:  Vitals Value Taken Time  BP    Temp    Pulse 89 07/17/22 1310  Resp 16 07/17/22 1310  SpO2 99 % 07/17/22 1310  Vitals shown include unvalidated device data.  Last Pain:  Vitals:   07/17/22 1016  TempSrc: Oral  PainSc: 0-No pain      Patients Stated Pain Goal: 3 (46/95/07 2257)  Complications: No notable events documented.

## 2022-07-17 NOTE — Anesthesia Postprocedure Evaluation (Signed)
Anesthesia Post Note  Patient: Danielle Ritter  Procedure(s) Performed: RIGHT BREAST LUMPECTOMY WITH RADIOACTIVE SEED LOCALIZATION (Right: Breast)     Patient location during evaluation: Phase II Anesthesia Type: General Level of consciousness: awake Pain management: pain level controlled Vital Signs Assessment: post-procedure vital signs reviewed and stable Respiratory status: spontaneous breathing Cardiovascular status: stable Postop Assessment: no apparent nausea or vomiting Anesthetic complications: no   No notable events documented.  Last Vitals:  Vitals:   07/17/22 1326 07/17/22 1330  BP:  (!) 123/57  Pulse: 90 91  Resp: 20 19  Temp:    SpO2: 97% 97%    Last Pain:  Vitals:   07/17/22 1340  TempSrc:   PainSc: 3                  [employer]

## 2022-07-18 ENCOUNTER — Encounter (HOSPITAL_BASED_OUTPATIENT_CLINIC_OR_DEPARTMENT_OTHER): Payer: Self-pay | Admitting: Surgery

## 2022-07-18 ENCOUNTER — Encounter: Payer: Self-pay | Admitting: *Deleted

## 2022-07-19 DIAGNOSIS — K219 Gastro-esophageal reflux disease without esophagitis: Secondary | ICD-10-CM | POA: Diagnosis not present

## 2022-07-19 DIAGNOSIS — M1991 Primary osteoarthritis, unspecified site: Secondary | ICD-10-CM | POA: Diagnosis not present

## 2022-07-19 DIAGNOSIS — E039 Hypothyroidism, unspecified: Secondary | ICD-10-CM | POA: Diagnosis not present

## 2022-07-19 DIAGNOSIS — C50911 Malignant neoplasm of unspecified site of right female breast: Secondary | ICD-10-CM | POA: Diagnosis not present

## 2022-07-19 DIAGNOSIS — E559 Vitamin D deficiency, unspecified: Secondary | ICD-10-CM | POA: Diagnosis not present

## 2022-07-19 DIAGNOSIS — M810 Age-related osteoporosis without current pathological fracture: Secondary | ICD-10-CM | POA: Diagnosis not present

## 2022-07-19 DIAGNOSIS — Z0001 Encounter for general adult medical examination with abnormal findings: Secondary | ICD-10-CM | POA: Diagnosis not present

## 2022-07-19 DIAGNOSIS — E119 Type 2 diabetes mellitus without complications: Secondary | ICD-10-CM | POA: Diagnosis not present

## 2022-07-19 DIAGNOSIS — E782 Mixed hyperlipidemia: Secondary | ICD-10-CM | POA: Diagnosis not present

## 2022-07-19 LAB — SURGICAL PATHOLOGY

## 2022-07-23 ENCOUNTER — Encounter: Payer: Self-pay | Admitting: *Deleted

## 2022-07-24 ENCOUNTER — Encounter (HOSPITAL_COMMUNITY): Payer: Self-pay

## 2022-07-31 ENCOUNTER — Inpatient Hospital Stay: Payer: Medicare Other | Admitting: Hematology and Oncology

## 2022-07-31 ENCOUNTER — Telehealth: Payer: Self-pay

## 2022-07-31 ENCOUNTER — Encounter: Payer: Self-pay | Admitting: *Deleted

## 2022-07-31 ENCOUNTER — Inpatient Hospital Stay: Payer: Medicare Other

## 2022-07-31 NOTE — Telephone Encounter (Signed)
Patient left VM yesterday afternoon wanting to know if she could meet with Dr. Isidore Moos today after her appointment with Dr. Chryl Heck  Returned patient's call to let her know Dr. Isidore Moos had availability, but patient reported she has decided she would like to receive her care at [facility] and [facility]. She asked that I pass along her request to her team here at [facility]. Patient denied any other needs at this time.

## 2022-08-02 DIAGNOSIS — Z23 Encounter for immunization: Secondary | ICD-10-CM | POA: Diagnosis not present

## 2022-08-10 DIAGNOSIS — D0511 Intraductal carcinoma in situ of right breast: Secondary | ICD-10-CM | POA: Diagnosis not present

## 2022-08-14 DIAGNOSIS — D0511 Intraductal carcinoma in situ of right breast: Secondary | ICD-10-CM | POA: Diagnosis not present

## 2022-08-17 DIAGNOSIS — D0511 Intraductal carcinoma in situ of right breast: Secondary | ICD-10-CM | POA: Diagnosis not present

## 2022-08-20 DIAGNOSIS — H40013 Open angle with borderline findings, low risk, bilateral: Secondary | ICD-10-CM | POA: Diagnosis not present

## 2022-08-20 DIAGNOSIS — H524 Presbyopia: Secondary | ICD-10-CM | POA: Diagnosis not present

## 2022-08-24 DIAGNOSIS — D0511 Intraductal carcinoma in situ of right breast: Secondary | ICD-10-CM | POA: Diagnosis not present

## 2022-08-27 DIAGNOSIS — D0511 Intraductal carcinoma in situ of right breast: Secondary | ICD-10-CM | POA: Diagnosis not present

## 2022-08-28 DIAGNOSIS — D0511 Intraductal carcinoma in situ of right breast: Secondary | ICD-10-CM | POA: Diagnosis not present

## 2022-08-29 DIAGNOSIS — D0511 Intraductal carcinoma in situ of right breast: Secondary | ICD-10-CM | POA: Diagnosis not present

## 2022-08-30 DIAGNOSIS — D0511 Intraductal carcinoma in situ of right breast: Secondary | ICD-10-CM | POA: Diagnosis not present

## 2022-08-31 DIAGNOSIS — D0511 Intraductal carcinoma in situ of right breast: Secondary | ICD-10-CM | POA: Diagnosis not present

## 2022-09-03 DIAGNOSIS — D0511 Intraductal carcinoma in situ of right breast: Secondary | ICD-10-CM | POA: Diagnosis not present

## 2022-09-04 DIAGNOSIS — D0511 Intraductal carcinoma in situ of right breast: Secondary | ICD-10-CM | POA: Diagnosis not present

## 2022-09-05 DIAGNOSIS — D0511 Intraductal carcinoma in situ of right breast: Secondary | ICD-10-CM | POA: Diagnosis not present

## 2022-09-10 DIAGNOSIS — D0511 Intraductal carcinoma in situ of right breast: Secondary | ICD-10-CM | POA: Diagnosis not present

## 2022-09-11 DIAGNOSIS — D0511 Intraductal carcinoma in situ of right breast: Secondary | ICD-10-CM | POA: Diagnosis not present

## 2022-09-12 DIAGNOSIS — D0511 Intraductal carcinoma in situ of right breast: Secondary | ICD-10-CM | POA: Diagnosis not present

## 2022-09-13 DIAGNOSIS — D0511 Intraductal carcinoma in situ of right breast: Secondary | ICD-10-CM | POA: Diagnosis not present

## 2022-09-14 DIAGNOSIS — D0511 Intraductal carcinoma in situ of right breast: Secondary | ICD-10-CM | POA: Diagnosis not present

## 2022-09-17 DIAGNOSIS — D0511 Intraductal carcinoma in situ of right breast: Secondary | ICD-10-CM | POA: Diagnosis not present

## 2022-09-18 DIAGNOSIS — D0511 Intraductal carcinoma in situ of right breast: Secondary | ICD-10-CM | POA: Diagnosis not present

## 2022-09-19 DIAGNOSIS — D0511 Intraductal carcinoma in situ of right breast: Secondary | ICD-10-CM | POA: Diagnosis not present

## 2022-09-20 DIAGNOSIS — D0511 Intraductal carcinoma in situ of right breast: Secondary | ICD-10-CM | POA: Diagnosis not present

## 2022-09-21 DIAGNOSIS — D0511 Intraductal carcinoma in situ of right breast: Secondary | ICD-10-CM | POA: Diagnosis not present

## 2022-09-24 DIAGNOSIS — D0511 Intraductal carcinoma in situ of right breast: Secondary | ICD-10-CM | POA: Diagnosis not present

## 2022-09-25 DIAGNOSIS — D0511 Intraductal carcinoma in situ of right breast: Secondary | ICD-10-CM | POA: Diagnosis not present

## 2022-09-26 DIAGNOSIS — D0511 Intraductal carcinoma in situ of right breast: Secondary | ICD-10-CM | POA: Diagnosis not present

## 2022-10-24 DIAGNOSIS — D0511 Intraductal carcinoma in situ of right breast: Secondary | ICD-10-CM | POA: Diagnosis not present

## 2022-11-04 IMAGING — MG DIGITAL DIAGNOSTIC BILAT W/ TOMO W/ CAD
6 of 10 series · 6 of 26 positions shown · non-contrast
Comparison: Previous exam(s).

CLINICAL DATA: Patient presents with waxing waning left breast
pain, along the lateral breast extending to the axilla. No reported
lumps.

EXAM:
DIGITAL DIAGNOSTIC BILATERAL MAMMOGRAM WITH TOMOSYNTHESIS AND CAD
TECHNIQUE: Bilateral digital diagnostic mammography and breast tomosynthesis
was performed. The images were evaluated with computer-aided
detection.

[R CC]
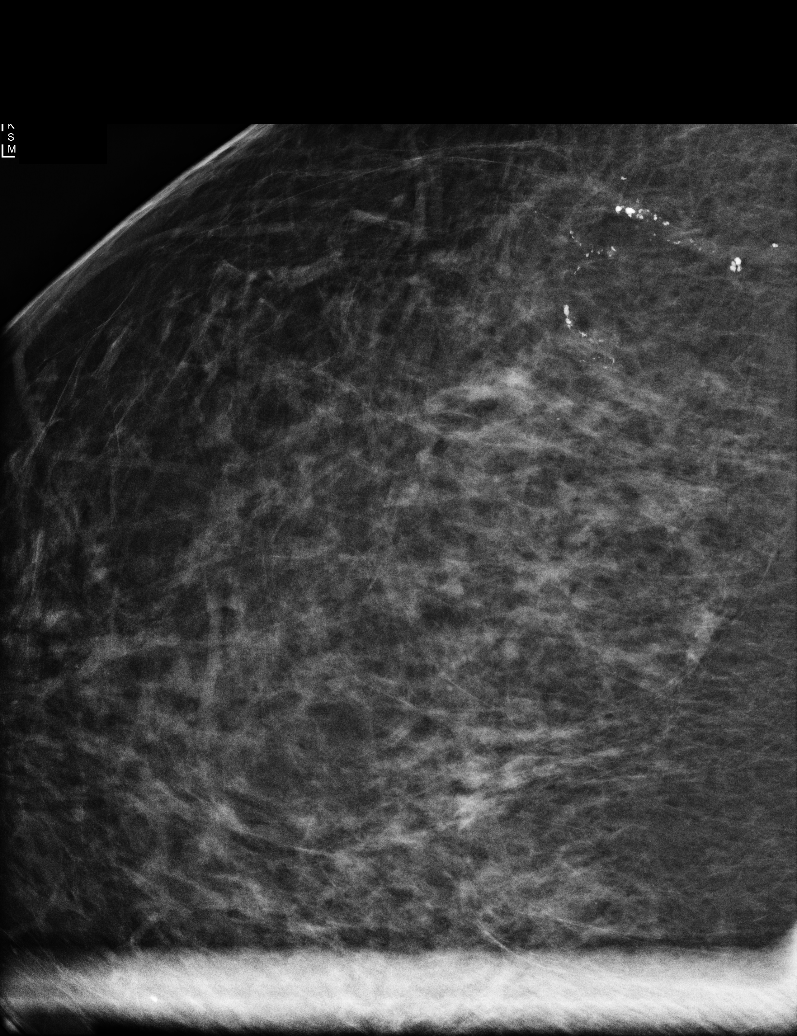

[R ML]
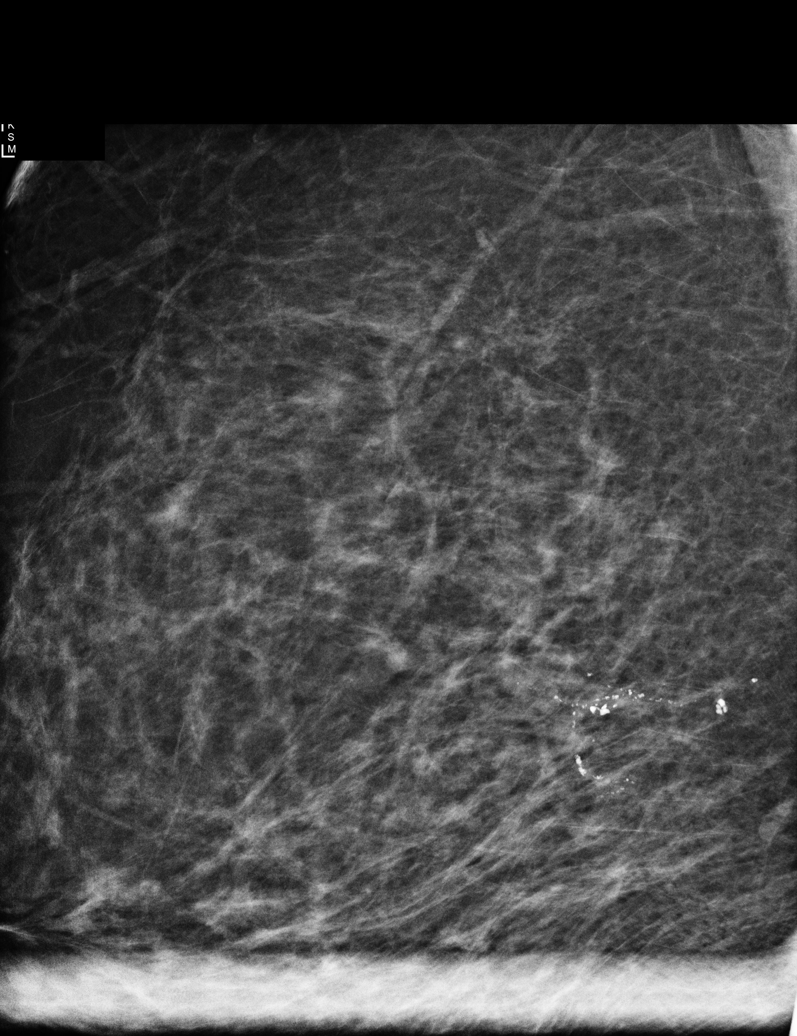

[R CC synth-2D]
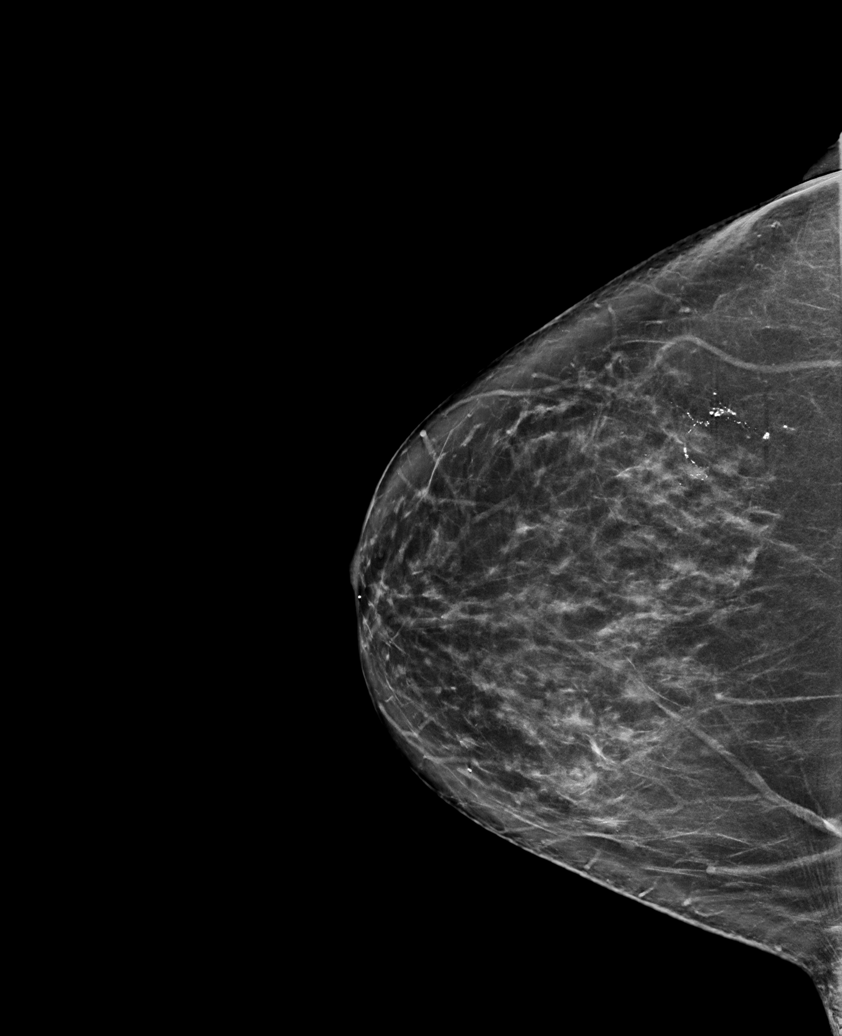

[L MLO synth-2D]
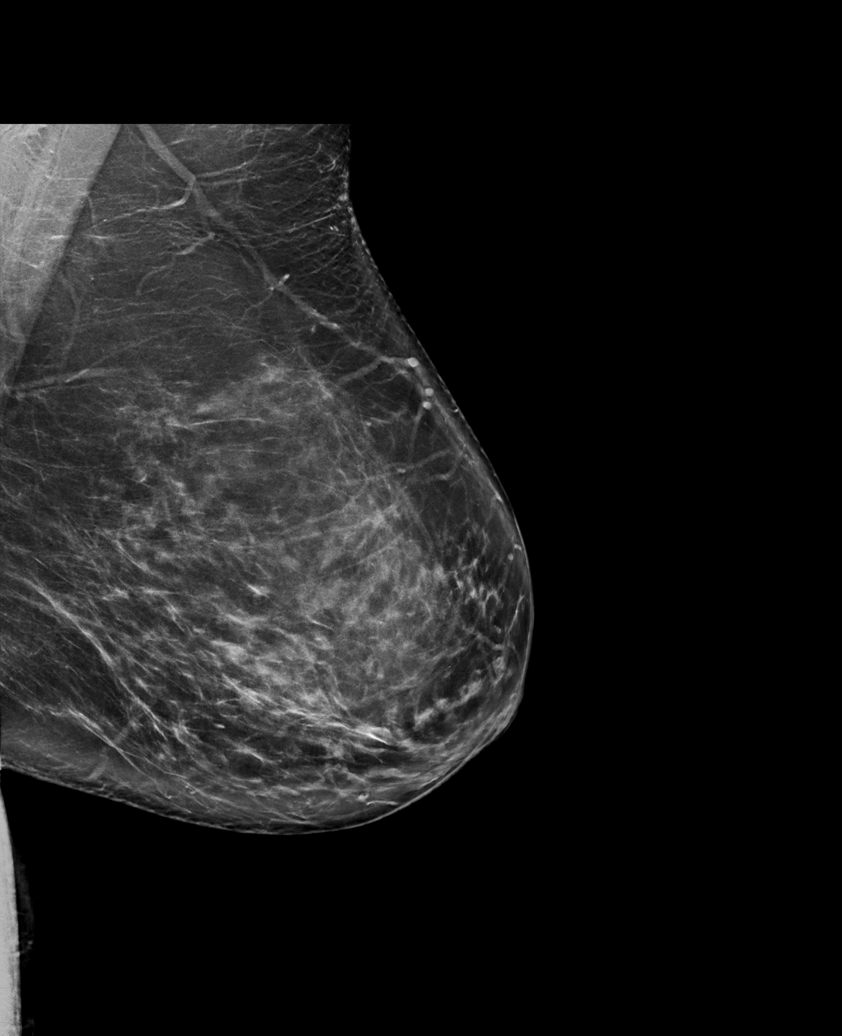

[R MLO synth-2D]
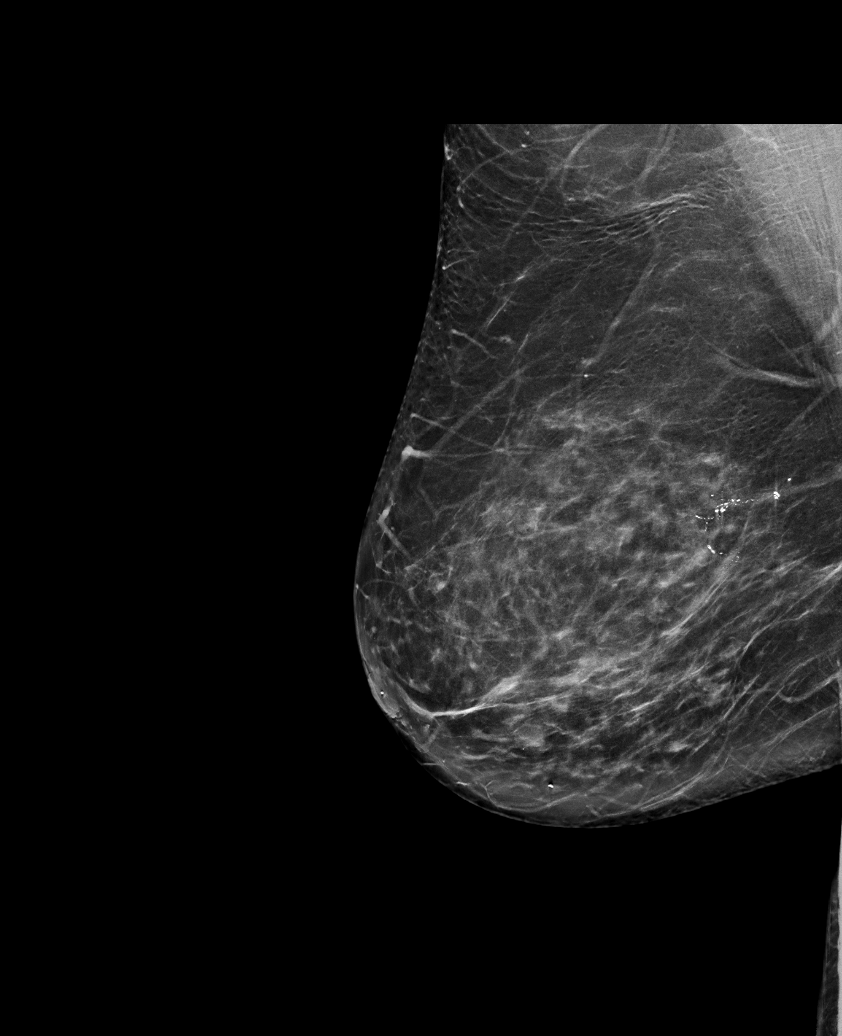

[L CC synth-2D]
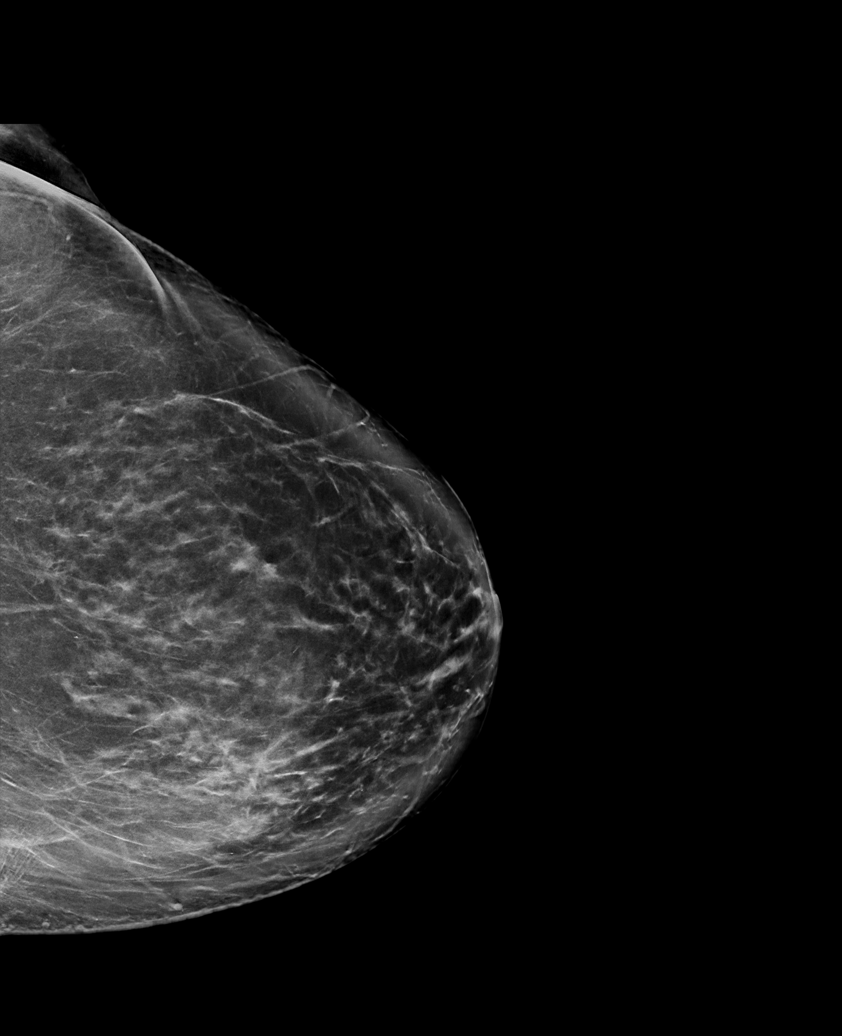

[6 of 26 positions shown; findings below may reference images not displayed]

ACR Breast Density Category c: The breast tissue is heterogeneously
dense, which may obscure small masses.
FINDINGS: On the right, there is a group of calcifications that span a maximum
of 3 cm, in the posterolateral breast. Some of these clearly align
with a blood vessel wall, others have a configuration and curves
away from the nipple also consistent with vascular calcifications.
There is no associated mass or distortion.

There are no breast masses, other significant calcifications, areas
of significant asymmetry or areas of architectural distortion. No
other mammographic change.
IMPRESSION: 1. Probably benign right breast calcifications, most likely all
vascular. Short-term follow-up recommended.

RECOMMENDATION:
1. Right breast diagnostic mammography with magnification views in 6
months.

I have discussed the findings and recommendations with the patient.
If applicable, a reminder letter will be sent to the patient
regarding the next appointment.

BI-RADS CATEGORY  3: Probably benign.

## 2023-01-02 DIAGNOSIS — M25562 Pain in left knee: Secondary | ICD-10-CM | POA: Diagnosis not present

## 2023-01-02 DIAGNOSIS — M25561 Pain in right knee: Secondary | ICD-10-CM | POA: Diagnosis not present

## 2023-01-21 DIAGNOSIS — E039 Hypothyroidism, unspecified: Secondary | ICD-10-CM | POA: Diagnosis not present

## 2023-01-21 DIAGNOSIS — M1991 Primary osteoarthritis, unspecified site: Secondary | ICD-10-CM | POA: Diagnosis not present

## 2023-01-21 DIAGNOSIS — R7309 Other abnormal glucose: Secondary | ICD-10-CM | POA: Diagnosis not present

## 2023-01-21 DIAGNOSIS — M810 Age-related osteoporosis without current pathological fracture: Secondary | ICD-10-CM | POA: Diagnosis not present

## 2023-01-21 DIAGNOSIS — E7849 Other hyperlipidemia: Secondary | ICD-10-CM | POA: Diagnosis not present

## 2023-01-21 DIAGNOSIS — Z01818 Encounter for other preprocedural examination: Secondary | ICD-10-CM | POA: Diagnosis not present

## 2023-01-21 DIAGNOSIS — C50911 Malignant neoplasm of unspecified site of right female breast: Secondary | ICD-10-CM | POA: Diagnosis not present

## 2023-01-21 DIAGNOSIS — K219 Gastro-esophageal reflux disease without esophagitis: Secondary | ICD-10-CM | POA: Diagnosis not present

## 2023-02-07 DIAGNOSIS — M170 Bilateral primary osteoarthritis of knee: Secondary | ICD-10-CM | POA: Diagnosis not present

## 2023-03-14 ENCOUNTER — Encounter (HOSPITAL_COMMUNITY): Payer: Self-pay

## 2023-03-15 NOTE — Patient Instructions (Signed)
DUE TO COVID-19 ONLY TWO VISITORS  (aged 79 and older)  ARE ALLOWED TO COME WITH YOU AND STAY IN THE WAITING ROOM ONLY DURING PRE OP AND PROCEDURE.   **NO VISITORS ARE ALLOWED IN THE SHORT STAY AREA OR RECOVERY ROOM!!**  IF YOU WILL BE ADMITTED INTO THE HOSPITAL YOU ARE ALLOWED ONLY FOUR SUPPORT PEOPLE DURING VISITATION HOURS ONLY (7 AM -8PM)   The support person(s) must pass our screening, gel in and out, and wear a mask at all times, including in the patient's room. Patients must also wear a mask when staff or their support person are in the room. Visitors GUEST BADGE MUST BE WORN VISIBLY  One adult visitor may remain with you overnight and MUST be in the room by 8 P.M.     Your procedure is scheduled on: 04/01/23   Report to [facility] Main Entrance    Report to admitting at : 5:50 AM   Call this number if you have problems the morning of surgery [phone]   Do not eat food :After Midnight.   After Midnight you may have the following liquids until : 5:00 AM DAY OF SURGERY  Water Black Coffee (sugar ok, NO MILK/CREAM OR CREAMERS)  Tea (sugar ok, NO MILK/CREAM OR CREAMERS) regular and decaf                             Plain Jell-O (NO RED)                                           Fruit ices (not with fruit pulp, NO RED)                                     Popsicles (NO RED)                                                                  Juice: apple, WHITE grape, WHITE cranberry Sports drinks like Gatorade (NO RED)   The day of surgery:  Drink ONE (1) Pre-Surgery Clear Ensure at : 5:00 AM the morning of surgery. Drink in one sitting. Do not sip.  This drink was given to you during your hospital  pre-op appointment visit. Nothing else to drink after completing the  Pre-Surgery Clear Ensure or G2.          If you have questions, please contact your surgeon's office.    Oral Hygiene is also important to reduce your risk of infection.                                     Remember - BRUSH YOUR TEETH THE MORNING OF SURGERY WITH YOUR REGULAR TOOTHPASTE  DENTURES WILL BE REMOVED PRIOR TO SURGERY PLEASE DO NOT APPLY "Poly grip" OR ADHESIVES!!!   Do NOT smoke after Midnight   Take these medicines the morning of surgery with A SIP OF WATER: loratadine,levothyroxine,omeprazole.  You may not have any metal on your body including hair pins, jewelry, and body piercing             Do not wear make-up, lotions, powders, perfumes/cologne, or deodorant  Do not wear nail polish including gel and S&S, artificial/acrylic nails, or any other type of covering on natural nails including finger and toenails. If you have artificial nails, gel coating, etc. that needs to be removed by a nail salon please have this removed prior to surgery or surgery may need to be canceled/ delayed if the surgeon/ anesthesia feels like they are unable to be safely monitored.   Do not shave  48 hours prior to surgery.    Do not bring valuables to the hospital. [facility] IS NOT             RESPONSIBLE   FOR VALUABLES.   Contacts, glasses, or bridgework may not be worn into surgery.   Bring small overnight bag day of surgery.   DO NOT BRING YOUR HOME MEDICATIONS TO THE HOSPITAL. PHARMACY WILL DISPENSE MEDICATIONS LISTED ON YOUR MEDICATION LIST TO YOU DURING YOUR ADMISSION IN THE HOSPITAL!    Patients discharged on the day of surgery will not be allowed to drive home.  Someone NEEDS to stay with you for the first 24 hours after anesthesia.   Special Instructions: Bring a copy of your healthcare power of attorney and living will documents         the day of surgery if you haven't scanned them before.              Please read over the following fact sheets you were given: IF YOU HAVE QUESTIONS ABOUT YOUR PRE-OP INSTRUCTIONS PLEASE CALL [phone]      Pre-operative 5 CHG Bath Instructions   You can play a key role in reducing the risk of infection after  surgery. Your skin needs to be as free of germs as possible. You can reduce the number of germs on your skin by washing with CHG (chlorhexidine gluconate) soap before surgery. CHG is an antiseptic soap that kills germs and continues to kill germs even after washing.   DO NOT use if you have an allergy to chlorhexidine/CHG or antibacterial soaps. If your skin becomes reddened or irritated, stop using the CHG and notify one of our RNs at : [phone].   Please shower with the CHG soap starting 4 days before surgery using the following schedule:     Please keep in mind the following:  DO NOT shave, including legs and underarms, starting the day of your first shower.   You may shave your face at any point before/day of surgery.  Place clean sheets on your bed the day you start using CHG soap. Use a clean washcloth (not used since being washed) for each shower. DO NOT sleep with pets once you start using the CHG.   CHG Shower Instructions:  If you choose to wash your hair and private area, wash first with your normal shampoo/soap.  After you use shampoo/soap, rinse your hair and body thoroughly to remove shampoo/soap residue.  Turn the water OFF and apply about 3 tablespoons (45 ml) of CHG soap to a CLEAN washcloth.  Apply CHG soap ONLY FROM YOUR NECK DOWN TO YOUR TOES (washing for 3-5 minutes)  DO NOT use CHG soap on face, private areas, open wounds, or sores.  Pay special attention to the area where your surgery is being performed.  If you are having back surgery, having someone wash your back for you may be helpful. Wait 2 minutes after CHG soap is applied, then you may rinse off the CHG soap.  Pat dry with a clean towel  Put on clean clothes/pajamas   If you choose to wear lotion, please use ONLY the CHG-compatible lotions on the back of this paper.     Additional instructions for the day of surgery: DO NOT APPLY any lotions, deodorants, cologne, or perfumes.   Put on clean/comfortable  clothes.  Brush your teeth.  Ask your nurse before applying any prescription medications to the skin.      CHG Compatible Lotions   Aveeno Moisturizing lotion  Cetaphil Moisturizing Cream  Cetaphil Moisturizing Lotion  Clairol Herbal Essence Moisturizing Lotion, Dry Skin  Clairol Herbal Essence Moisturizing Lotion, Extra Dry Skin  Clairol Herbal Essence Moisturizing Lotion, Normal Skin  Curel Age Defying Therapeutic Moisturizing Lotion with Alpha Hydroxy  Curel Extreme Care Body Lotion  Curel Soothing Hands Moisturizing Hand Lotion  Curel Therapeutic Moisturizing Cream, Fragrance-Free  Curel Therapeutic Moisturizing Lotion, Fragrance-Free  Curel Therapeutic Moisturizing Lotion, Original Formula  Eucerin Daily Replenishing Lotion  Eucerin Dry Skin Therapy Plus Alpha Hydroxy Crme  Eucerin Dry Skin Therapy Plus Alpha Hydroxy Lotion  Eucerin Original Crme  Eucerin Original Lotion  Eucerin Plus Crme Eucerin Plus Lotion  Eucerin TriLipid Replenishing Lotion  Keri Anti-Bacterial Hand Lotion  Keri Deep Conditioning Original Lotion Dry Skin Formula Softly Scented  Keri Deep Conditioning Original Lotion, Fragrance Free Sensitive Skin Formula  Keri Lotion Fast Absorbing Fragrance Free Sensitive Skin Formula  Keri Lotion Fast Absorbing Softly Scented Dry Skin Formula  Keri Original Lotion  Keri Skin Renewal Lotion Keri Silky Smooth Lotion  Keri Silky Smooth Sensitive Skin Lotion  Nivea Body Creamy Conditioning Oil  Nivea Body Extra Enriched Lotion  Nivea Body Original Lotion  Nivea Body Sheer Moisturizing Lotion Nivea Crme  Nivea Skin Firming Lotion  NutraDerm 30 Skin Lotion  NutraDerm Skin Lotion  NutraDerm Therapeutic Skin Cream  NutraDerm Therapeutic Skin Lotion  ProShield Protective Hand Cream  Provon moisturizing lotion   Incentive Spirometer  An incentive spirometer is a tool that can help keep your lungs clear and active. This tool measures how well you are filling  your lungs with each breath. Taking long deep breaths may help reverse or decrease the chance of developing breathing (pulmonary) problems (especially infection) following: A long period of time when you are unable to move or be active. BEFORE THE PROCEDURE  If the spirometer includes an indicator to show your best effort, your nurse or respiratory therapist will set it to a desired goal. If possible, sit up straight or lean slightly forward. Try not to slouch. Hold the incentive spirometer in an upright position. INSTRUCTIONS FOR USE  Sit on the edge of your bed if possible, or sit up as far as you can in bed or on a chair. Hold the incentive spirometer in an upright position. Breathe out normally. Place the mouthpiece in your mouth and seal your lips tightly around it. Breathe in slowly and as deeply as possible, raising the piston or the ball toward the top of the column. Hold your breath for 3-5 seconds or for as long as possible. Allow the piston or ball to fall to the bottom of the column. Remove the mouthpiece from your mouth and breathe out normally. Rest for a few seconds and repeat Steps 1 through 7 at least 10 times every  1-2 hours when you are awake. Take your time and take a few normal breaths between deep breaths. The spirometer may include an indicator to show your best effort. Use the indicator as a goal to work toward during each repetition. After each set of 10 deep breaths, practice coughing to be sure your lungs are clear. If you have an incision (the cut made at the time of surgery), support your incision when coughing by placing a pillow or rolled up towels firmly against it. Once you are able to get out of bed, walk around indoors and cough well. You may stop using the incentive spirometer when instructed by your caregiver.  RISKS AND COMPLICATIONS Take your time so you do not get dizzy or light-headed. If you are in pain, you may need to take or ask for pain medication  before doing incentive spirometry. It is harder to take a deep breath if you are having pain. AFTER USE Rest and breathe slowly and easily. It can be helpful to keep track of a log of your progress. Your caregiver can provide you with a simple table to help with this. If you are using the spirometer at home, follow these instructions: SEEK MEDICAL CARE IF:  You are having difficultly using the spirometer. You have trouble using the spirometer as often as instructed. Your pain medication is not giving enough relief while using the spirometer. You develop fever of 100.5 F (38.1 C) or higher. SEEK IMMEDIATE MEDICAL CARE IF:  You cough up bloody sputum that had not been present before. You develop fever of 102 F (38.9 C) or greater. You develop worsening pain at or near the incision site. MAKE SURE YOU:  Understand these instructions. Will watch your condition. Will get help right away if you are not doing well or get worse. Document Released: 02/11/2007 Document Revised: 12/24/2011 Document Reviewed: 04/14/2007 [facility] Patient Information 2014 [place], [place].   ________________________________________________________________________

## 2023-03-18 DIAGNOSIS — M1712 Unilateral primary osteoarthritis, left knee: Secondary | ICD-10-CM | POA: Diagnosis not present

## 2023-03-18 DIAGNOSIS — M25562 Pain in left knee: Secondary | ICD-10-CM | POA: Diagnosis not present

## 2023-03-18 NOTE — H&P (Signed)
TOTAL KNEE ADMISSION H&P  Patient is being admitted for left total knee arthroplasty.  Subjective:  Chief Complaint: Left knee pain.  HPI: Danielle Ritter, 77 y.o. female has a history of pain and functional disability in the left knee due to arthritis and has failed non-surgical conservative treatments for greater than 12 weeks to include corticosteriod injections and activity modification. Onset of symptoms was gradual, starting >10 years ago with gradually worsening course since that time. The patient noted no past surgery on the left knee.  Patient currently rates pain in the left knee at 7 out of 10 with activity. Patient has night pain, worsening of pain with activity and weight bearing, pain with passive range of motion, and crepitus. Patient has evidence of  Well nourished and well developed.   by imaging studies. This patient has had bone on bone medial compartments both knees left worse than right with tibial subluxation and marginal osteophytes. The left knee is also bone on bone patellofemoral. There is no active infection.  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   Ankle swelling 06/07/2016   High cholesterol 06/07/2016   Gallstones 06/07/2016   Gastroesophageal reflux disease without esophagitis 06/07/2016    Past Medical History:  Diagnosis Date   Allergies    Arthritis    Cancer (HCC) 05/2022   right breast DCIS   GERD (gastroesophageal reflux disease)    Hypothyroidism     Past Surgical History:  Procedure Laterality Date   ABDOMINAL HYSTERECTOMY     APPENDECTOMY     bladder tact     BREAST LUMPECTOMY WITH RADIOACTIVE SEED LOCALIZATION Right 07/17/2022   Procedure: RIGHT BREAST LUMPECTOMY WITH RADIOACTIVE SEED LOCALIZATION;  Surgeon: Harriette Bouillon, MD;  Location: [facility];  Service: General;  Laterality: Right;   BREAST SURGERY     Nipple revision   CHOLECYSTECTOMY N/A  06/20/2016   Procedure: LAPAROSCOPIC CHOLECYSTECTOMY;  Surgeon: Franky Macho, MD;  Location: [facility];  Service: General;  Laterality: N/A;   ESOPHAGOGASTRODUODENOSCOPY N/A 08/09/2016   Procedure: ESOPHAGOGASTRODUODENOSCOPY (EGD);  Surgeon: Malissa Hippo, MD;  Location: [facility];  Service: Endoscopy;  Laterality: N/A;  3:00 - moved to 10/26 @ 2:25   hystorectomy      Prior to Admission medications   Medication Sig Start Date End Date Taking? Authorizing Provider  ascorbic acid (VITAMIN C) 500 MG tablet Take 500 mg by mouth daily.   Yes [provider]  Calcium Carb-Cholecalciferol (CALCIUM 600-D PO) Take 1 tablet by mouth daily.   Yes [provider]  Cholecalciferol (VITAMIN D3 ULTRA STRENGTH) 125 MCG (5000 UT) capsule Take 5,000 Units by mouth daily.   Yes [provider]  Cinnamon 500 MG TABS Take 500 mg by mouth daily.   Yes [provider]  Cyanocobalamin 2500 MCG CHEW Chew 2,500 mcg by mouth daily.   Yes [provider]  GLUCOSAMINE-CHONDROITIN PO Take 1 tablet by mouth daily.   Yes [provider]  levothyroxine (SYNTHROID) 50 MCG tablet Take 50 mcg by mouth daily before breakfast.   Yes [provider]  loratadine (CLARITIN) 10 MG tablet Take 10 mg by mouth daily.   Yes [provider]  omeprazole (PRILOSEC) 20 MG capsule Take 20 mg by mouth See admin instructions. Take 20 mg daily, may take a second 20 mg dose as needed for heartburn   Yes [provider]  vitamin E  400 UNIT capsule Take 800 Units by mouth daily.   Yes [provider]    Allergies  Allergen Reactions   Ciprofloxacin Other (See Comments)    Joint pain   Codeine Nausea And Vomiting   Latex Rash    Social History   Socioeconomic History   Marital status: Married    Spouse name: Not on file   Number of children: Not on file   Years of education: Not on file   Highest education level: Not on file  Occupational History    Not on file  Tobacco Use   Smoking status: Never   Smokeless tobacco: Never  Vaping Use   Vaping Use: Never used  Substance and Sexual Activity   Alcohol use: No   Drug use: No   Sexual activity: Not Currently  Other Topics Concern   Not on file  Social History Narrative   Not on file   Social Determinants of Health   Financial Resource Strain: Not on file  Food Insecurity: Not on file  Transportation Needs: Not on file  Physical Activity: Not on file  Stress: Not on file  Social Connections: Not on file  Intimate Partner Violence: Not on file    Tobacco Use: Low Risk  (07/18/2022)   Patient History    Smoking Tobacco Use: Never    Smokeless Tobacco Use: Never    Passive Exposure: Not on file   Social History   Substance and Sexual Activity  Alcohol Use No    Family History  Problem Relation Age of Onset   Emphysema Mother    Hodgkin's lymphoma Father    Heart failure Sister    Ovarian cancer Sister    Diabetes Brother    Prostate cancer Brother     Review of Systems  Constitutional:  Negative for chills and fever.  HENT:  Negative for congestion, sore throat and tinnitus.   Eyes:  Negative for double vision, photophobia and pain.  Respiratory:  Negative for cough, shortness of breath and wheezing.   Cardiovascular:  Negative for chest pain, palpitations and orthopnea.  Gastrointestinal:  Negative for heartburn, nausea and vomiting.  Genitourinary:  Negative for dysuria, frequency and urgency.  Musculoskeletal:  Positive for joint pain.  Neurological:  Negative for dizziness, weakness and headaches.    Objective:  Physical Exam: Well nourished and well developed.  General: Alert and oriented x3, cooperative and pleasant, no acute distress.  Head: normocephalic, atraumatic, neck supple.  Eyes: EOMI.  Musculoskeletal:  Left knee  No effusion; range 0-130 with crepitus; tender medial greater than lateral with no instability  Calves soft and  nontender. Motor function intact in LE. Strength 5/5 LE bilaterally. Neuro: Distal pulses 2+. Sensation to light touch intact in LE.  Imaging Review Plain radiographs demonstrate severe degenerative joint disease of the left knee. The overall alignment is mild varus. The bone quality appears to be adequate for age and reported activity level.  Assessment/Plan:  End stage arthritis, left knee   The patient history, physical examination, clinical judgment of the provider and imaging studies are consistent with end stage degenerative joint disease of the left knee and total knee arthroplasty is deemed medically necessary. The treatment options including medical management, injection therapy arthroscopy and arthroplasty were discussed at length. The risks and benefits of total knee arthroplasty were presented and reviewed. The risks due to aseptic loosening, infection, stiffness, patella tracking problems, thromboembolic complications and other imponderables were discussed. The patient acknowledged the explanation, agreed  to proceed with the plan and consent was signed. Patient is being admitted for inpatient treatment for surgery, pain control, PT, OT, prophylactic antibiotics, VTE prophylaxis, progressive ambulation and ADLs and discharge planning. The patient is planning to be discharged  home .   Patient's anticipated LOS is less than 2 midnights, meeting these requirements: - Lives within 1 hour of care - Has a competent adult at home to recover with post-op recover - NO history of  - Chronic pain requiring opioids  - Diabetes  - Coronary Artery Disease  - Heart failure  - Heart attack  - Stroke  - DVT/VTE  - Cardiac arrhythmia  - Respiratory Failure/COPD  - Renal failure  - Anemia  - Advanced Liver disease  Therapy Plans: Outpatient therapy at [facility]) Disposition: Home with husband Planned DVT Prophylaxis: Xarelto 10 mg QD DME Needed: None PCP: Assunta Found, MD (clearance  received) TXA: IV Allergies: Codeine (nausea) Anesthesia Concerns: None BMI: 29.1 Last HgbA1c: Not diabetic  Pharmacy: [facility])  Other: - 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 Will plan for IV TXA. - Severe nausea with codeine - discussed low dose dilaudid postop with tramadol and tylenol for mainstay  - Patient was instructed on what medications to stop prior to surgery. - Follow-up visit in 2 weeks with Dr. Lequita Halt - Begin physical therapy following surgery - Pre-operative lab work as pre-surgical testing - Prescriptions will be provided in hospital at time of discharge  Arther Abbott, PA-C Orthopedic Surgery [facility] [place]

## 2023-03-19 ENCOUNTER — Other Ambulatory Visit: Payer: Self-pay

## 2023-03-19 ENCOUNTER — Encounter (HOSPITAL_COMMUNITY): Payer: Self-pay

## 2023-03-19 ENCOUNTER — Encounter (HOSPITAL_COMMUNITY)
Admission: RE | Admit: 2023-03-19 | Discharge: 2023-03-19 | Disposition: A | Discharge status: Home / Self Care | Payer: Medicare Other | Source: Ambulatory Visit | Attending: Orthopedic Surgery | Admitting: Orthopedic Surgery

## 2023-03-19 VITALS — BP 152/64 | HR 73 | Temp 97.6°F | Ht 60.0 in | Wt 148.0 lb

## 2023-03-19 DIAGNOSIS — Z01818 Encounter for other preprocedural examination: Secondary | ICD-10-CM | POA: Diagnosis not present

## 2023-03-19 LAB — SURGICAL PCR SCREEN
MRSA, PCR: NEGATIVE
Staphylococcus aureus: NEGATIVE

## 2023-03-19 LAB — CBC
HCT: 41.3 % (ref 36.0–46.0)
Hemoglobin: 13.9 g/dL (ref 12.0–15.0)
MCH: 31.2 pg (ref 26.0–34.0)
MCHC: 33.7 g/dL (ref 30.0–36.0)
MCV: 92.8 fL (ref 80.0–100.0)
Platelets: 205 10*3/uL (ref 150–400)
RBC: 4.45 MIL/uL (ref 3.87–5.11)
RDW: 13.2 % (ref 11.5–15.5)
WBC: 7.6 10*3/uL (ref 4.0–10.5)
nRBC: 0 % (ref 0.0–0.2)

## 2023-03-19 NOTE — Progress Notes (Signed)
For Short Stay: COVID SWAB appointment date:  Bowel Prep reminder:   For Anesthesia: PCP - Dr. Assunta Found Cardiologist - N/A  Chest x-ray -  EKG -  Stress Test -  ECHO -  Cardiac Cath -  Pacemaker/ICD device last checked: Pacemaker orders received: Device Rep notified:  Spinal Cord Stimulator: N/A  Sleep Study - Yes CPAP - No  Fasting Blood Sugar - N/A Checks Blood Sugar _____ times a day Date and result of last Hgb A1c-  Last dose of GLP1 agonist- N/A GLP1 instructions:   Last dose of SGLT-2 inhibitors- N/A SGLT-2 instructions:   Blood Thinner Instructions: N/A Aspirin Instructions: Last Dose:  Activity level: Can go up a flight of stairs and activities of daily living without stopping and without chest pain and/or shortness of breath   Able to exercise without chest pain and/or shortness of breath  Anesthesia review:   Patient denies shortness of breath, fever, cough and chest pain at PAT appointment   Patient verbalized understanding of instructions that were given to them at the PAT appointment. Patient was also instructed that they will need to review over the PAT instructions again at home before surgery.

## 2023-03-31 NOTE — Anesthesia Preprocedure Evaluation (Signed)
Anesthesia Evaluation  Patient identified by MRN, date of birth, ID band Patient awake    Reviewed: Allergy & Precautions, H&P , NPO status , Patient's Chart, lab work & pertinent test results  Airway Mallampati: II  TM Distance: >3 FB Neck ROM: Full    Dental no notable dental hx. (+) Teeth Intact, Dental Advisory Given   Pulmonary neg pulmonary ROS   Pulmonary exam normal breath sounds clear to auscultation       Cardiovascular Exercise Tolerance: Good negative cardio ROS  Rhythm:Regular Rate:Normal     Neuro/Psych negative neurological ROS  negative psych ROS   GI/Hepatic Neg liver ROS,GERD  Medicated,,  Endo/Other  Hypothyroidism    Renal/GU negative Renal ROS  negative genitourinary   Musculoskeletal  (+) Arthritis ,    Abdominal   Peds  Hematology negative hematology ROS (+)   Anesthesia Other Findings   Reproductive/Obstetrics negative OB ROS                             Anesthesia Physical Anesthesia Plan  ASA: 2  Anesthesia Plan: Spinal   Post-op Pain Management: Regional block* and Tylenol PO (pre-op)*   Induction: Intravenous  PONV Risk Score and Plan: 3 and Ondansetron, Dexamethasone and Propofol infusion  Airway Management Planned: Natural Airway and Simple Face Mask  Additional Equipment:   Intra-op Plan:   Post-operative Plan:   Informed Consent: I have reviewed the patients History and Physical, chart, labs and discussed the procedure including the risks, benefits and alternatives for the proposed anesthesia with the patient or authorized representative who has indicated his/her understanding and acceptance.     Dental advisory given  Plan Discussed with: CRNA  Anesthesia Plan Comments:        Anesthesia Quick Evaluation

## 2023-04-01 ENCOUNTER — Encounter (HOSPITAL_COMMUNITY): Payer: Self-pay | Admitting: Orthopedic Surgery

## 2023-04-01 ENCOUNTER — Ambulatory Visit (HOSPITAL_COMMUNITY): Payer: Medicare Other | Admitting: Anesthesiology

## 2023-04-01 ENCOUNTER — Other Ambulatory Visit: Payer: Self-pay

## 2023-04-01 ENCOUNTER — Observation Stay (HOSPITAL_COMMUNITY)
Admission: RE | Admit: 2023-04-01 | Discharge: 2023-04-02 | Disposition: A | Discharge status: Home / Self Care | Payer: Medicare Other | Source: Ambulatory Visit | Attending: Orthopedic Surgery | Admitting: Orthopedic Surgery

## 2023-04-01 ENCOUNTER — Ambulatory Visit (HOSPITAL_BASED_OUTPATIENT_CLINIC_OR_DEPARTMENT_OTHER): Payer: Medicare Other | Admitting: Anesthesiology

## 2023-04-01 ENCOUNTER — Encounter (HOSPITAL_COMMUNITY)
Admission: RE | Disposition: A | Discharge status: Home / Self Care | Payer: Self-pay | Source: Ambulatory Visit | Attending: Orthopedic Surgery

## 2023-04-01 DIAGNOSIS — G8918 Other acute postprocedural pain: Secondary | ICD-10-CM | POA: Diagnosis not present

## 2023-04-01 DIAGNOSIS — Z91040 Latex allergy status: Secondary | ICD-10-CM | POA: Insufficient documentation

## 2023-04-01 DIAGNOSIS — Z79899 Other long term (current) drug therapy: Secondary | ICD-10-CM | POA: Diagnosis not present

## 2023-04-01 DIAGNOSIS — Z853 Personal history of malignant neoplasm of breast: Secondary | ICD-10-CM | POA: Insufficient documentation

## 2023-04-01 DIAGNOSIS — M179 Osteoarthritis of knee, unspecified: Secondary | ICD-10-CM | POA: Diagnosis present

## 2023-04-01 DIAGNOSIS — E039 Hypothyroidism, unspecified: Secondary | ICD-10-CM | POA: Insufficient documentation

## 2023-04-01 DIAGNOSIS — M1712 Unilateral primary osteoarthritis, left knee: Secondary | ICD-10-CM

## 2023-04-01 HISTORY — PX: TOTAL KNEE ARTHROPLASTY: SHX125

## 2023-04-01 SURGERY — ARTHROPLASTY, KNEE, TOTAL
Anesthesia: Spinal | Site: Knee | Laterality: Left

## 2023-04-01 MED ORDER — FENTANYL CITRATE PF 50 MCG/ML IJ SOSY
PREFILLED_SYRINGE | INTRAMUSCULAR | Status: AC
  Administered 2023-04-01: 50 ug
  Filled 2023-04-01: qty 2

## 2023-04-01 MED ORDER — HYDROMORPHONE HCL 1 MG/ML IJ SOLN
0.2500 mg | INTRAMUSCULAR | Status: DC | PRN
  Administered 2023-04-01: 0.25 mg via INTRAVENOUS
  Administered 2023-04-01: 0.5 mg via INTRAVENOUS
  Administered 2023-04-01: 0.25 mg via INTRAVENOUS

## 2023-04-01 MED ORDER — ONDANSETRON HCL 4 MG PO TABS: 4.0000 mg | ORAL_TABLET | Freq: Four times a day (QID) | ORAL | Status: DC | PRN

## 2023-04-01 MED ORDER — MORPHINE SULFATE (PF) 2 MG/ML IV SOLN: 1.0000 mg | INTRAVENOUS | Status: DC | PRN

## 2023-04-01 MED ORDER — METOCLOPRAMIDE HCL 5 MG PO TABS: 5.0000 mg | ORAL_TABLET | Freq: Three times a day (TID) | ORAL | Status: DC | PRN

## 2023-04-01 MED ORDER — PROPOFOL 500 MG/50ML IV EMUL
INTRAVENOUS | Status: DC | PRN
  Administered 2023-04-01: 50 ug/kg/min via INTRAVENOUS

## 2023-04-01 MED ORDER — SODIUM CHLORIDE (PF) 0.9 % IJ SOLN
INTRAMUSCULAR | Status: AC
  Filled 2023-04-01: qty 50

## 2023-04-01 MED ORDER — PANTOPRAZOLE SODIUM 40 MG PO TBEC
40.0000 mg | DELAYED_RELEASE_TABLET | Freq: Every day | ORAL | Status: DC
  Administered 2023-04-02: 40 mg via ORAL
  Filled 2023-04-01: qty 1

## 2023-04-01 MED ORDER — DEXAMETHASONE SODIUM PHOSPHATE 10 MG/ML IJ SOLN
INTRAMUSCULAR | Status: DC | PRN
  Administered 2023-04-01: 10 mg via INTRAVENOUS

## 2023-04-01 MED ORDER — LACTATED RINGERS IV SOLN: INTRAVENOUS | Status: DC

## 2023-04-01 MED ORDER — TRANEXAMIC ACID-NACL 1000-0.7 MG/100ML-% IV SOLN
1000.0000 mg | INTRAVENOUS | Status: AC
  Administered 2023-04-01: 1000 mg via INTRAVENOUS
  Filled 2023-04-01: qty 100

## 2023-04-01 MED ORDER — ACETAMINOPHEN 10 MG/ML IV SOLN
1000.0000 mg | Freq: Four times a day (QID) | INTRAVENOUS | Status: DC
  Administered 2023-04-01: 1000 mg via INTRAVENOUS
  Filled 2023-04-01: qty 100

## 2023-04-01 MED ORDER — SODIUM CHLORIDE 0.9 % IV SOLN
INTRAVENOUS | Status: DC | PRN
  Administered 2023-04-01: 80 mL

## 2023-04-01 MED ORDER — DOCUSATE SODIUM 100 MG PO CAPS
100.0000 mg | ORAL_CAPSULE | Freq: Two times a day (BID) | ORAL | Status: DC
  Administered 2023-04-01 – 2023-04-02 (×2): 100 mg via ORAL
  Filled 2023-04-01 (×2): qty 1

## 2023-04-01 MED ORDER — BUPIVACAINE-EPINEPHRINE (PF) 0.5% -1:200000 IJ SOLN
INTRAMUSCULAR | Status: DC | PRN
  Administered 2023-04-01: 20 mL via PERINEURAL

## 2023-04-01 MED ORDER — 0.9 % SODIUM CHLORIDE (POUR BTL) OPTIME
TOPICAL | Status: DC | PRN
  Administered 2023-04-01: 1000 mL

## 2023-04-01 MED ORDER — DIPHENHYDRAMINE HCL 12.5 MG/5ML PO ELIX: 12.5000 mg | ORAL_SOLUTION | ORAL | Status: DC | PRN

## 2023-04-01 MED ORDER — DEXAMETHASONE SODIUM PHOSPHATE 10 MG/ML IJ SOLN
10.0000 mg | Freq: Once | INTRAMUSCULAR | Status: AC
  Administered 2023-04-02: 10 mg via INTRAVENOUS
  Filled 2023-04-01: qty 1

## 2023-04-01 MED ORDER — BUPIVACAINE LIPOSOME 1.3 % IJ SUSP: 20.0000 mL | Freq: Once | INTRAMUSCULAR | Status: DC

## 2023-04-01 MED ORDER — METHOCARBAMOL 500 MG PO TABS: 500.0000 mg | ORAL_TABLET | Freq: Four times a day (QID) | ORAL | Status: DC | PRN

## 2023-04-01 MED ORDER — OXYCODONE HCL 5 MG PO TABS
5.0000 mg | ORAL_TABLET | ORAL | Status: DC | PRN
  Administered 2023-04-01 – 2023-04-02 (×3): 5 mg via ORAL
  Filled 2023-04-01 (×3): qty 1

## 2023-04-01 MED ORDER — DIPHENHYDRAMINE HCL 50 MG/ML IJ SOLN
INTRAMUSCULAR | Status: DC | PRN
  Administered 2023-04-01: 12.5 mg via INTRAVENOUS

## 2023-04-01 MED ORDER — LORATADINE 10 MG PO TABS
10.0000 mg | ORAL_TABLET | Freq: Every day | ORAL | Status: DC
  Administered 2023-04-02: 10 mg via ORAL
  Filled 2023-04-01: qty 1

## 2023-04-01 MED ORDER — POLYETHYLENE GLYCOL 3350 17 G PO PACK: 17.0000 g | PACK | Freq: Every day | ORAL | Status: DC | PRN

## 2023-04-01 MED ORDER — LEVOTHYROXINE SODIUM 50 MCG PO TABS
50.0000 ug | ORAL_TABLET | Freq: Every day | ORAL | Status: DC
  Filled 2023-04-01: qty 1

## 2023-04-01 MED ORDER — PROPOFOL 10 MG/ML IV BOLUS
INTRAVENOUS | Status: DC | PRN
  Administered 2023-04-01: 30 mg via INTRAVENOUS
  Administered 2023-04-01: 20 mg via INTRAVENOUS

## 2023-04-01 MED ORDER — BISACODYL 10 MG RE SUPP: 10.0000 mg | Freq: Every day | RECTAL | Status: DC | PRN

## 2023-04-01 MED ORDER — CEFAZOLIN SODIUM-DEXTROSE 2-4 GM/100ML-% IV SOLN
2.0000 g | Freq: Four times a day (QID) | INTRAVENOUS | Status: AC
  Administered 2023-04-01 (×2): 2 g via INTRAVENOUS
  Filled 2023-04-01 (×2): qty 100

## 2023-04-01 MED ORDER — SODIUM CHLORIDE (PF) 0.9 % IJ SOLN
INTRAMUSCULAR | Status: AC
  Filled 2023-04-01: qty 10

## 2023-04-01 MED ORDER — ACETAMINOPHEN 500 MG PO TABS
1000.0000 mg | ORAL_TABLET | Freq: Four times a day (QID) | ORAL | Status: AC
  Administered 2023-04-01 – 2023-04-02 (×4): 1000 mg via ORAL
  Filled 2023-04-01 (×4): qty 2

## 2023-04-01 MED ORDER — METHOCARBAMOL 500 MG IVPB - SIMPLE MED
INTRAVENOUS | Status: AC
  Filled 2023-04-01: qty 55

## 2023-04-01 MED ORDER — SODIUM CHLORIDE 0.9 % IR SOLN
Status: DC | PRN
  Administered 2023-04-01: 1000 mL

## 2023-04-01 MED ORDER — DEXAMETHASONE SODIUM PHOSPHATE 10 MG/ML IJ SOLN
INTRAMUSCULAR | Status: AC
  Filled 2023-04-01: qty 1

## 2023-04-01 MED ORDER — MENTHOL 3 MG MT LOZG: 1.0000 | LOZENGE | OROMUCOSAL | Status: DC | PRN

## 2023-04-01 MED ORDER — ONDANSETRON HCL 4 MG/2ML IJ SOLN
INTRAMUSCULAR | Status: DC | PRN
  Administered 2023-04-01: 4 mg via INTRAVENOUS

## 2023-04-01 MED ORDER — METOCLOPRAMIDE HCL 5 MG/ML IJ SOLN
5.0000 mg | Freq: Three times a day (TID) | INTRAMUSCULAR | Status: DC | PRN
  Administered 2023-04-01: 10 mg via INTRAVENOUS
  Filled 2023-04-01: qty 2

## 2023-04-01 MED ORDER — FLEET ENEMA 7-19 GM/118ML RE ENEM: 1.0000 | ENEMA | Freq: Once | RECTAL | Status: DC | PRN

## 2023-04-01 MED ORDER — PROPOFOL 1000 MG/100ML IV EMUL
INTRAVENOUS | Status: AC
  Filled 2023-04-01: qty 100

## 2023-04-01 MED ORDER — ORAL CARE MOUTH RINSE: 15.0000 mL | Freq: Once | OROMUCOSAL | Status: AC

## 2023-04-01 MED ORDER — POVIDONE-IODINE 10 % EX SWAB: 2.0000 | Freq: Once | CUTANEOUS | Status: DC

## 2023-04-01 MED ORDER — LIDOCAINE HCL (PF) 2 % IJ SOLN
INTRAMUSCULAR | Status: AC
  Filled 2023-04-01: qty 5

## 2023-04-01 MED ORDER — SODIUM CHLORIDE 0.9 % IV SOLN: INTRAVENOUS | Status: DC

## 2023-04-01 MED ORDER — HYDROMORPHONE HCL 1 MG/ML IJ SOLN
INTRAMUSCULAR | Status: AC
  Filled 2023-04-01: qty 1

## 2023-04-01 MED ORDER — BUPIVACAINE LIPOSOME 1.3 % IJ SUSP
INTRAMUSCULAR | Status: AC
  Filled 2023-04-01: qty 20

## 2023-04-01 MED ORDER — METHOCARBAMOL 500 MG IVPB - SIMPLE MED
500.0000 mg | Freq: Four times a day (QID) | INTRAVENOUS | Status: DC | PRN
  Administered 2023-04-01: 500 mg via INTRAVENOUS

## 2023-04-01 MED ORDER — DEXAMETHASONE SODIUM PHOSPHATE 10 MG/ML IJ SOLN: 8.0000 mg | Freq: Once | INTRAMUSCULAR | Status: DC

## 2023-04-01 MED ORDER — PHENYLEPHRINE HCL-NACL 20-0.9 MG/250ML-% IV SOLN
INTRAVENOUS | Status: DC | PRN
  Administered 2023-04-01: 50 ug/min via INTRAVENOUS

## 2023-04-01 MED ORDER — PROPOFOL 10 MG/ML IV BOLUS
INTRAVENOUS | Status: AC
  Filled 2023-04-01: qty 20

## 2023-04-01 MED ORDER — PHENOL 1.4 % MT LIQD: 1.0000 | OROMUCOSAL | Status: DC | PRN

## 2023-04-01 MED ORDER — CHLORHEXIDINE GLUCONATE 0.12 % MT SOLN
15.0000 mL | Freq: Once | OROMUCOSAL | Status: AC
  Administered 2023-04-01: 15 mL via OROMUCOSAL

## 2023-04-01 MED ORDER — BUPIVACAINE IN DEXTROSE 0.75-8.25 % IT SOLN
INTRATHECAL | Status: DC | PRN
  Administered 2023-04-01: 1.6 mL via INTRATHECAL

## 2023-04-01 MED ORDER — ACETAMINOPHEN 500 MG PO TABS: 1000.0000 mg | ORAL_TABLET | Freq: Once | ORAL | Status: AC

## 2023-04-01 MED ORDER — RIVAROXABAN 10 MG PO TABS
10.0000 mg | ORAL_TABLET | Freq: Every day | ORAL | Status: DC
  Administered 2023-04-02: 10 mg via ORAL
  Filled 2023-04-01: qty 1

## 2023-04-01 MED ORDER — ONDANSETRON HCL 4 MG/2ML IJ SOLN
4.0000 mg | Freq: Four times a day (QID) | INTRAMUSCULAR | Status: DC | PRN
  Administered 2023-04-01: 4 mg via INTRAVENOUS
  Filled 2023-04-01: qty 2

## 2023-04-01 MED ORDER — CEFAZOLIN SODIUM-DEXTROSE 2-4 GM/100ML-% IV SOLN
2.0000 g | INTRAVENOUS | Status: AC
  Administered 2023-04-01: 2 g via INTRAVENOUS
  Filled 2023-04-01: qty 100

## 2023-04-01 MED ORDER — TRAMADOL HCL 50 MG PO TABS
50.0000 mg | ORAL_TABLET | Freq: Four times a day (QID) | ORAL | Status: DC | PRN
  Administered 2023-04-01: 100 mg via ORAL
  Administered 2023-04-02: 50 mg via ORAL
  Administered 2023-04-02: 100 mg via ORAL
  Filled 2023-04-01 (×2): qty 2
  Filled 2023-04-01: qty 1

## 2023-04-01 MED ORDER — ONDANSETRON HCL 4 MG/2ML IJ SOLN
INTRAMUSCULAR | Status: AC
  Filled 2023-04-01: qty 2

## 2023-04-01 SURGICAL SUPPLY — 57 items
ADH SKN CLS APL DERMABOND .7 (GAUZE/BANDAGES/DRESSINGS) ×1
ATTUNE PS FEM LT SZ 5 CEM KNEE (Femur) IMPLANT
ATTUNE PSRP INSR SZ5 8 KNEE (Insert) IMPLANT
BAG COUNTER SPONGE SURGICOUNT (BAG) IMPLANT
BAG SPEC THK2 15X12 ZIP CLS (MISCELLANEOUS) ×1
BAG SPNG CNTER NS LX DISP (BAG)
BAG ZIPLOCK 12X15 (MISCELLANEOUS) ×1 IMPLANT
BASEPLATE TIBIAL ROTATING SZ 4 (Knees) IMPLANT
BLADE SAG 18X100X1.27 (BLADE) ×1 IMPLANT
BLADE SAW SGTL 11.0X1.19X90.0M (BLADE) ×1 IMPLANT
BNDG CMPR 5X62 HK CLSR LF (GAUZE/BANDAGES/DRESSINGS) ×1
BNDG CMPR MED 10X6 ELC LF (GAUZE/BANDAGES/DRESSINGS) ×1
BNDG ELASTIC 6INX 5YD STR LF (GAUZE/BANDAGES/DRESSINGS) ×1 IMPLANT
BNDG ELASTIC 6X10 VLCR STRL LF (GAUZE/BANDAGES/DRESSINGS) IMPLANT
BOWL SMART MIX CTS (DISPOSABLE) ×1 IMPLANT
BSPLAT TIB 4 CMNT ROT PLAT STR (Knees) ×1 IMPLANT
CEMENT HV SMART SET (Cement) ×2 IMPLANT
COVER SURGICAL LIGHT HANDLE (MISCELLANEOUS) ×1 IMPLANT
CUFF TOURN SGL QUICK 34 (TOURNIQUET CUFF) ×1
CUFF TRNQT CYL 34X4.125X (TOURNIQUET CUFF) ×1 IMPLANT
DERMABOND ADVANCED .7 DNX12 (GAUZE/BANDAGES/DRESSINGS) ×1 IMPLANT
DRAPE INCISE IOBAN 66X45 STRL (DRAPES) ×1 IMPLANT
DRAPE U-SHAPE 47X51 STRL (DRAPES) ×1 IMPLANT
DRSG AQUACEL AG ADV 3.5X10 (GAUZE/BANDAGES/DRESSINGS) ×1 IMPLANT
DURAPREP 26ML APPLICATOR (WOUND CARE) ×1 IMPLANT
ELECT REM PT RETURN 15FT ADLT (MISCELLANEOUS) ×1 IMPLANT
GLOVE BIO SURGEON STRL SZ 6.5 (GLOVE) IMPLANT
GLOVE BIO SURGEON STRL SZ8 (GLOVE) ×1 IMPLANT
GLOVE BIOGEL PI IND STRL 6.5 (GLOVE) IMPLANT
GLOVE BIOGEL PI IND STRL 7.0 (GLOVE) IMPLANT
GLOVE BIOGEL PI IND STRL 8 (GLOVE) ×1 IMPLANT
GOWN STRL REUS W/ TWL LRG LVL3 (GOWN DISPOSABLE) ×1 IMPLANT
GOWN STRL REUS W/TWL LRG LVL3 (GOWN DISPOSABLE) ×1
HANDPIECE INTERPULSE COAX TIP (DISPOSABLE) ×1
HOLDER FOLEY CATH W/STRAP (MISCELLANEOUS) IMPLANT
IMMOBILIZER KNEE 20 (SOFTGOODS) ×1
IMMOBILIZER KNEE 20 THIGH 36 (SOFTGOODS) ×1 IMPLANT
KIT TURNOVER KIT A (KITS) IMPLANT
MANIFOLD NEPTUNE II (INSTRUMENTS) ×1 IMPLANT
NS IRRIG 1000ML POUR BTL (IV SOLUTION) ×1 IMPLANT
PACK TOTAL KNEE CUSTOM (KITS) ×1 IMPLANT
PADDING CAST ABS COTTON 6X4 NS (CAST SUPPLIES) IMPLANT
PADDING CAST COTTON 6X4 STRL (CAST SUPPLIES) ×2 IMPLANT
PATELLA MEDIAL ATTUN 35MM KNEE (Knees) IMPLANT
PIN STEINMAN FIXATION KNEE (PIN) IMPLANT
PROTECTOR NERVE ULNAR (MISCELLANEOUS) ×1 IMPLANT
SET HNDPC FAN SPRY TIP SCT (DISPOSABLE) ×1 IMPLANT
SPIKE FLUID TRANSFER (MISCELLANEOUS) ×1 IMPLANT
SUT MNCRL AB 4-0 PS2 18 (SUTURE) ×1 IMPLANT
SUT STRATAFIX 0 PDS 27 VIOLET (SUTURE) ×1
SUT VIC AB 2-0 CT1 27 (SUTURE) ×3
SUT VIC AB 2-0 CT1 TAPERPNT 27 (SUTURE) ×3 IMPLANT
SUTURE STRATFX 0 PDS 27 VIOLET (SUTURE) ×1 IMPLANT
TRAY FOLEY MTR SLVR 16FR STAT (SET/KITS/TRAYS/PACK) ×1 IMPLANT
TUBE SUCTION HIGH CAP CLEAR NV (SUCTIONS) ×1 IMPLANT
WATER STERILE IRR 1000ML POUR (IV SOLUTION) ×2 IMPLANT
WRAP KNEE MAXI GEL POST OP (GAUZE/BANDAGES/DRESSINGS) ×1 IMPLANT

## 2023-04-01 NOTE — Progress Notes (Signed)
Orthopedic Tech Progress Note Patient Details:  Danielle Ritter 27-Mar-1946 [id number]  CPM Left Knee CPM Left Knee: Off Left Knee Flexion (Degrees): 40 Left Knee Extension (Degrees): 10  Post Interventions Patient Tolerated: Well Instructions Provided: Care of device, Adjustment of device  [name] 04/01/2023, 2:27 PM

## 2023-04-01 NOTE — Interval H&P Note (Signed)
History and Physical Interval Note:  04/01/2023 6:31 AM  Danielle Ritter  has presented today for surgery, with the diagnosis of left knee osteoarthritis.  The various methods of treatment have been discussed with the patient and family. After consideration of risks, benefits and other options for treatment, the patient has consented to  Procedure(s): TOTAL KNEE ARTHROPLASTY (Left) as a surgical intervention.  The patient's history has been reviewed, patient examined, no change in status, stable for surgery.  I have reviewed the patient's chart and labs.  Questions were answered to the patient's satisfaction.     Homero Fellers Ranell Skibinski

## 2023-04-01 NOTE — Transfer of Care (Signed)
Immediate Anesthesia Transfer of Care Note  Patient: [name]  Procedure(s) Performed: TOTAL KNEE ARTHROPLASTY (Left: Knee)  Patient Location: PACU  Anesthesia Type:Spinal  Level of Consciousness: awake and alert   Airway & Oxygen Therapy: Patient Spontanous Breathing and Patient connected to face mask oxygen  Post-op Assessment: Report given to RN and Post -op Vital signs reviewed and stable  Post vital signs: Reviewed and stable  Last Vitals:  Vitals Value Taken Time  BP 128/64 04/01/23 0955  Temp    Pulse 71 04/01/23 0957  Resp 16 04/01/23 0957  SpO2 100 % 04/01/23 0957  Vitals shown include unvalidated device data.  Last Pain:  Vitals:   04/01/23 0718  TempSrc:   PainSc: 0-No pain         Complications: No notable events documented.

## 2023-04-01 NOTE — Progress Notes (Signed)
Orthopedic Tech Progress Note Patient Details:  Danielle Ritter Oct 04, 1946 [id number]  Applied CPM. CPM Left Knee CPM Left Knee: On Left Knee Flexion (Degrees): 40 Left Knee Extension (Degrees): 10  Post Interventions Patient Tolerated: Well Instructions Provided: Care of device, Adjustment of device  Sherilyn Banker 04/01/2023, 10:18 AM

## 2023-04-01 NOTE — Anesthesia Procedure Notes (Signed)
Date/Time: 04/01/2023 8:15 AM  Performed by: Florene Route, CRNAOxygen Delivery Method: Simple face mask

## 2023-04-01 NOTE — Anesthesia Procedure Notes (Signed)
Anesthesia Regional Block: Adductor canal block   Pre-Anesthetic Checklist: , timeout performed,  Correct Patient, Correct Site, Correct Laterality,  Correct Procedure, Correct Position, site marked,  Risks and benefits discussed,  Pre-op evaluation,  At surgeon's request and post-op pain management  Laterality: Left  Prep: Maximum Sterile Barrier Precautions used, chloraprep       Needles:  Injection technique: Single-shot  Needle Type: Echogenic Stimulator Needle     Needle Length: 9cm  Needle Gauge: 21     Additional Needles:   Procedures:,,,, ultrasound used (permanent image in chart),,    Narrative:  Start time: 04/01/2023 7:01 AM End time: 04/01/2023 7:11 AM Injection made incrementally with aspirations every 5 mL.  Performed by: Personally  Anesthesiologist: Gaynelle Adu, MD  Additional Notes:

## 2023-04-01 NOTE — Plan of Care (Signed)
  Problem: Activity: Goal: Ability to avoid complications of mobility impairment will improve Outcome: Progressing Goal: Range of joint motion will improve Outcome: Progressing   Problem: Pain Management: Goal: Pain level will decrease with appropriate interventions Outcome: Progressing   

## 2023-04-01 NOTE — Op Note (Signed)
OPERATIVE REPORT-TOTAL KNEE ARTHROPLASTY   Pre-operative diagnosis- Osteoarthritis  Left knee(s)  Post-operative diagnosis- Osteoarthritis Left knee(s)  Procedure-  Left  Total Knee Arthroplasty  Surgeon- Danielle Ritter. Danielle Lucken, MD  Assistant- Danielle Brass, PA-C   Anesthesia-   Adductor canal block and spinal  EBL-50 mL   Drains None  Tourniquet time-  Total Tourniquet Time Documented: Thigh (Left) - 36 minutes Total: Thigh (Left) - 36 minutes     Complications- None  Condition-PACU - hemodynamically stable.   Brief Clinical Note  Danielle Ritter is a 77 y.o. year old female with end stage OA of her left knee with progressively worsening pain and dysfunction. She has constant pain, with activity and at rest and significant functional deficits with difficulties even with ADLs. She has had extensive non-op management including analgesics, injections of cortisone and viscosupplements, and home exercise program, but remains in significant pain with significant dysfunction. Radiographs show bone on bone arthritis medial and patellofemoral. She presents now for left Total Knee Arthroplasty.     Procedure in detail---   The patient is brought into the operating room and positioned supine on the operating table. After successful administration of  Adductor canal block and spinal,   a tourniquet is placed high on the  Left thigh(s) and the lower extremity is prepped and draped in the usual sterile fashion. Time out is performed by the operating team and then the  Left lower extremity is wrapped in Esmarch, knee flexed and the tourniquet inflated to 300 mmHg.       A midline incision is made with a ten blade through the subcutaneous tissue to the level of the extensor mechanism. A fresh blade is used to make a medial parapatellar arthrotomy. Soft tissue over the proximal medial tibia is subperiosteally elevated to the joint line with a knife and into the semimembranosus bursa with a Cobb  elevator. Soft tissue over the proximal lateral tibia is elevated with attention being paid to avoiding the patellar tendon on the tibial tubercle. The patella is everted, knee flexed 90 degrees and the ACL and PCL are removed. Findings are bone on bone medial and patellofemoral with large global osteophytes        The drill is used to create a starting hole in the distal femur and the canal is thoroughly irrigated with sterile saline to remove the fatty contents. The 5 degree Left  valgus alignment guide is placed into the femoral canal and the distal femoral cutting block is pinned to remove 9 mm off the distal femur. Resection is made with an oscillating saw.      The tibia is subluxed forward and the menisci are removed. The extramedullary alignment guide is placed referencing proximally at the medial aspect of the tibial tubercle and distally along the second metatarsal axis and tibial crest. The block is pinned to remove 2mm off the more deficient medial  side. Resection is made with an oscillating saw. Size 5is the most appropriate size for the tibia and the proximal tibia is prepared with the modular drill and keel punch for that size.      The femoral sizing guide is placed and size 6 is most appropriate. Rotation is marked off the epicondylar axis and confirmed by creating a rectangular flexion gap at 90 degrees. The size 6 cutting block is pinned in this rotation and the anterior, posterior and chamfer cuts are made with the oscillating saw. The intercondylar block is then placed and that cut is  made.      Trial size 5 tibial component, trial size 6 posterior stabilized femur and a 8  mm posterior stabilized rotating platform insert trial is placed. Full extension is achieved with excellent varus/valgus and anterior/posterior balance throughout full range of motion. The patella is everted and thickness measured to be 24  mm. Free hand resection is taken to 14 mm, a 35 template is placed, lug holes are  drilled, trial patella is placed, and it tracks normally. Osteophytes are removed off the posterior femur with the trial in place. All trials are removed and the cut bone surfaces prepared with pulsatile lavage. Cement is mixed and once ready for implantation, the size 5 tibial implant, size  6 posterior stabilized femoral component, and the size 35 patella are cemented in place and the patella is held with the clamp. The trial insert is placed and the knee held in full extension. The Exparel (20 ml mixed with 60 ml saline) is injected into the extensor mechanism, posterior capsule, medial and lateral gutters and subcutaneous tissues.  All extruded cement is removed and once the cement is hard the permanent 8 mm posterior stabilized rotating platform insert is placed into the tibial tray.      The wound is copiously irrigated with saline solution and the extensor mechanism closed with # 0 Stratofix suture. The tourniquet is released for a total tourniquet time of 36  minutes. Flexion against gravity is 140 degrees and the patella tracks normally. Subcutaneous tissue is closed with 2.0 vicryl and subcuticular with running 4.0 Monocryl. The incision is cleaned and dried and steri-strips and a bulky sterile dressing are applied. The limb is placed into a knee immobilizer and the patient is awakened and transported to recovery in stable condition.      Please note that a surgical assistant was a medical necessity for this procedure in order to perform it in a safe and expeditious manner. Surgical assistant was necessary to retract the ligaments and vital neurovascular structures to prevent injury to them and also necessary for proper positioning of the limb to allow for anatomic placement of the prosthesis.   Danielle Ritter Danielle Stanaland, MD    04/01/2023, 9:25 AM

## 2023-04-01 NOTE — Anesthesia Postprocedure Evaluation (Signed)
Anesthesia Post Note  Patient: Danielle Ritter  Procedure(s) Performed: TOTAL KNEE ARTHROPLASTY (Left: Knee)     Patient location during evaluation: PACU Anesthesia Type: Spinal Level of consciousness: oriented and awake and alert Pain management: pain level controlled Vital Signs Assessment: post-procedure vital signs reviewed and stable Respiratory status: spontaneous breathing and respiratory function stable Cardiovascular status: blood pressure returned to baseline and stable Postop Assessment: no headache, no backache, no apparent nausea or vomiting, spinal receding and patient able to bend at knees Anesthetic complications: no  No notable events documented.  Last Vitals:  Vitals:   04/01/23 1000 04/01/23 1015  BP: 135/69 138/66  Pulse: 75 62  Resp: 18 15  Temp:  36.4 C  SpO2: 99% 97%    Last Pain:  Vitals:   04/01/23 1023  TempSrc:   PainSc: 5                  [name],W. EDMOND

## 2023-04-01 NOTE — Anesthesia Procedure Notes (Signed)
Spinal  Patient location during procedure: OR Start time: 04/01/2023 8:17 AM End time: 04/01/2023 8:20 AM Reason for block: surgical anesthesia Staffing Performed: resident/CRNA  Resident/CRNA: Florene Route, CRNA Performed by: Florene Route, CRNA Authorized by: Gaynelle Adu, MD   Preanesthetic Checklist Completed: patient identified, IV checked, site marked, risks and benefits discussed, surgical consent, monitors and equipment checked, pre-op evaluation and timeout performed Spinal Block Patient position: sitting Prep: DuraPrep and site prepped and draped Patient monitoring: heart rate, continuous pulse ox and blood pressure Approach: midline Location: L4-5 Injection technique: single-shot Needle Needle type: Pencan  Needle gauge: 24 G Needle length: 10 cm Assessment Sensory level: T6 Events: CSF return Additional Notes Kit expiration date 01/12/2025 and lot #[id number] Clear free flow CSF, negative heme, negative paresthesia Tolerated well and returned to supine position

## 2023-04-01 NOTE — Discharge Instructions (Addendum)
Gaynelle Arabian, MD Total Joint Specialist [facility] [address], [address] [place],  [address] [phone]  TOTAL KNEE REPLACEMENT POSTOPERATIVE DIRECTIONS    Knee Rehabilitation, Guidelines Following Surgery  Results after knee surgery are often greatly improved when you follow the exercise, range of motion and muscle strengthening exercises prescribed by your doctor. Safety measures are also important to protect the knee from further injury. If any of these exercises cause you to have increased pain or swelling in your knee joint, decrease the amount until you are comfortable again and slowly increase them. If you have problems or questions, call your caregiver or physical therapist for advice.   BLOOD CLOT PREVENTION Take a 10 mg Xarelto once a day for three weeks following surgery. Then take an 81 mg Aspirin once a day for three weeks. Then discontinue Aspirin. You may resume your vitamins/supplements once you have discontinued the Xarelto. Do not take any NSAIDs (Advil, Aleve, Ibuprofen, Meloxicam, etc.) until you have discontinued the Xarelto.    HOME CARE INSTRUCTIONS  Remove items at home which could result in a fall. This includes throw rugs or furniture in walking pathways.  ICE to the affected knee as much as tolerated. Icing helps control swelling. If the swelling is well controlled you will be more comfortable and rehab easier. Continue to use ice on the knee for pain and swelling from surgery. You may notice swelling that will progress down to the foot and ankle. This is normal after surgery. Elevate the leg when you are not up walking on it.    Continue to use the breathing machine which will help keep your temperature down. It is common for your temperature to cycle up and down following surgery, especially at night when you are not up moving around and exerting yourself. The breathing machine keeps your lungs expanded and your temperature  down. Do not place pillow under the operative knee, focus on keeping the knee straight while resting  DIET You may resume your previous home diet once you are discharged from the hospital.  DRESSING / WOUND CARE / SHOWERING Keep your bulky bandage on for 2 days. On the third post-operative day you may remove the Ace bandage and gauze. There is a waterproof adhesive bandage on your skin which will stay in place until your first follow-up appointment. Once you remove this you will not need to place another bandage You may begin showering 3 days following surgery, but do not submerge the incision under water.  ACTIVITY For the first 5 days, the key is rest and control of pain and swelling Do your home exercises twice a day starting on post-operative day 3. On the days you go to physical therapy, just do the home exercises once that day. You should rest, ice and elevate the leg for 50 minutes out of every hour. Get up and walk/stretch for 10 minutes per hour. After 5 days you can increase your activity slowly as tolerated. Walk with your walker as instructed. Use the walker until you are comfortable transitioning to a cane. Walk with the cane in the opposite hand of the operative leg. You may discontinue the cane once you are comfortable and walking steadily. Avoid periods of inactivity such as sitting longer than an hour when not asleep. This helps prevent blood clots.  You may discontinue the knee immobilizer once you are able to perform a straight leg raise while lying down. You may resume a sexual relationship in one month  or when given the OK by your doctor.  You may return to work once you are cleared by your doctor.  Do not drive a car for 6 weeks or until released by your surgeon.  Do not drive while taking narcotics.  TED HOSE STOCKINGS Wear the elastic stockings on both legs for three weeks following surgery during the day. You may remove them at night for sleeping.  WEIGHT  BEARING Weight bearing as tolerated with assist device (walker, cane, etc) as directed, use it as long as suggested by your surgeon or therapist, typically at least 4-6 weeks.  POSTOPERATIVE CONSTIPATION PROTOCOL Constipation - defined medically as fewer than three stools per week and severe constipation as less than one stool per week.  One of the most common issues patients have following surgery is constipation.  Even if you have a regular bowel pattern at home, your normal regimen is likely to be disrupted due to multiple reasons following surgery.  Combination of anesthesia, postoperative narcotics, change in appetite and fluid intake all can affect your bowels.  In order to avoid complications following surgery, here are some recommendations in order to help you during your recovery period.  Colace (docusate) - Pick up an over-the-counter form of Colace or another stool softener and take twice a day as long as you are requiring postoperative pain medications.  Take with a full glass of water daily.  If you experience loose stools or diarrhea, hold the colace until you stool forms back up. If your symptoms do not get better within 1 week or if they get worse, check with your doctor. Dulcolax (bisacodyl) - Pick up over-the-counter and take as directed by the product packaging as needed to assist with the movement of your bowels.  Take with a full glass of water.  Use this product as needed if not relieved by Colace only.  MiraLax (polyethylene glycol) - Pick up over-the-counter to have on hand. MiraLax is a solution that will increase the amount of water in your bowels to assist with bowel movements.  Take as directed and can mix with a glass of water, juice, soda, coffee, or tea. Take if you go more than two days without a movement. Do not use MiraLax more than once per day. Call your doctor if you are still constipated or irregular after using this medication for 7 days in a row.  If you continue  to have problems with postoperative constipation, please contact the office for further assistance and recommendations.  If you experience "the worst abdominal pain ever" or develop nausea or vomiting, please contact the office immediatly for further recommendations for treatment.  ITCHING If you experience itching with your medications, try taking only a single pain pill, or even half a pain pill at a time.  You can also use Benadryl over the counter for itching or also to help with sleep.   MEDICATIONS See your medication summary on the "After Visit Summary" that the nursing staff will review with you prior to discharge.  You may have some home medications which will be placed on hold until you complete the course of blood thinner medication.  It is important for you to complete the blood thinner medication as prescribed by your surgeon.  Continue your approved medications as instructed at time of discharge.  PRECAUTIONS If you experience chest pain or shortness of breath - call 911 immediately for transfer to the hospital emergency department.  If you develop a fever greater that 101 F,   purulent drainage from wound, increased redness or drainage from wound, foul odor from the wound/dressing, or calf pain - CONTACT YOUR SURGEON.                                                   FOLLOW-UP APPOINTMENTS Make sure you keep all of your appointments after your operation with your surgeon and caregivers. You should call the office at the above phone number and make an appointment for approximately two weeks after the date of your surgery or on the date instructed by your surgeon outlined in the "After Visit Summary".  RANGE OF MOTION AND STRENGTHENING EXERCISES  Rehabilitation of the knee is important following a knee injury or an operation. After just a few days of immobilization, the muscles of the thigh which control the knee become weakened and shrink (atrophy). Knee exercises are designed to build up  the tone and strength of the thigh muscles and to improve knee motion. Often times heat used for twenty to thirty minutes before working out will loosen up your tissues and help with improving the range of motion but do not use heat for the first two weeks following surgery. These exercises can be done on a training (exercise) mat, on the floor, on a table or on a bed. Use what ever works the best and is most comfortable for you Knee exercises include:  Leg Lifts - While your knee is still immobilized in a splint or cast, you can do straight leg raises. Lift the leg to 60 degrees, hold for 3 sec, and slowly lower the leg. Repeat 10-20 times 2-3 times daily. Perform this exercise against resistance later as your knee gets better.  Quad and Hamstring Sets - Tighten up the muscle on the front of the thigh (Quad) and hold for 5-10 sec. Repeat this 10-20 times hourly. Hamstring sets are done by pushing the foot backward against an object and holding for 5-10 sec. Repeat as with quad sets.  Leg Slides: Lying on your back, slowly slide your foot toward your buttocks, bending your knee up off the floor (only go as far as is comfortable). Then slowly slide your foot back down until your leg is flat on the floor again. Angel Wings: Lying on your back spread your legs to the side as far apart as you can without causing discomfort.  A rehabilitation program following serious knee injuries can speed recovery and prevent re-injury in the future due to weakened muscles. Contact your doctor or a physical therapist for more information on knee rehabilitation.   POST-OPERATIVE OPIOID TAPER INSTRUCTIONS: It is important to wean off of your opioid medication as soon as possible. If you do not need pain medication after your surgery it is ok to stop day one. Opioids include: Codeine, Hydrocodone(Norco, Vicodin), Oxycodone(Percocet, oxycontin) and hydromorphone amongst others.  Long term and even short term use of opiods can  cause: Increased pain response Dependence Constipation Depression Respiratory depression And more.  Withdrawal symptoms can include Flu like symptoms Nausea, vomiting And more Techniques to manage these symptoms Hydrate well Eat regular healthy meals Stay active Use relaxation techniques(deep breathing, meditating, yoga) Do Not substitute Alcohol to help with tapering If you have been on opioids for less than two weeks and do not have pain than it is ok to stop all together.  Plan   to wean off of opioids This plan should start within one week post op of your joint replacement. Maintain the same interval or time between taking each dose and first decrease the dose.  Cut the total daily intake of opioids by one tablet each day Next start to increase the time between doses. The last dose that should be eliminated is the evening dose.   IF YOU ARE TRANSFERRED TO A SKILLED REHAB FACILITY If the patient is transferred to a skilled rehab facility following release from the hospital, a list of the current medications will be sent to the facility for the patient to continue.  When discharged from the skilled rehab facility, please have the facility set up the patient's Home Health Physical Therapy prior to being released. Also, the skilled facility will be responsible for providing the patient with their medications at time of release from the facility to include their pain medication, the muscle relaxants, and their blood thinner medication. If the patient is still at the rehab facility at time of the two week follow up appointment, the skilled rehab facility will also need to assist the patient in arranging follow up appointment in our office and any transportation needs.  MAKE SURE YOU:  Understand these instructions.  Get help right away if you are not doing well or get worse.   DENTAL ANTIBIOTICS:  In most cases prophylactic antibiotics for Dental procdeures after total joint surgery are  not necessary.  Exceptions are as follows:  1. History of prior total joint infection  2. Severely immunocompromised (Organ Transplant, cancer chemotherapy, Rheumatoid biologic meds such as Humera)  3. Poorly controlled diabetes (A1C &gt; 8.0, blood glucose over 200)  If you have one of these conditions, contact your surgeon for an antibiotic prescription, prior to your dental procedure.    Pick up stool softner and laxative for home use following surgery while on pain medications. Do not submerge incision under water. Please use good hand washing techniques while changing dressing each day. May shower starting three days after surgery. Please use a clean towel to pat the incision dry following showers. Continue to use ice for pain and swelling after surgery. Do not use any lotions or creams on the incision until instructed by your surgeon.     Information on my medicine - XARELTO (Rivaroxaban)  This medication education was reviewed with me or my healthcare representative as part of my discharge preparation.  The pharmacist that spoke with me during my hospital stay was:  Pasty Spillers, [facility]  Why was Xarelto prescribed for you? Xarelto was prescribed for you to reduce the risk of blood clots forming after orthopedic surgery. The medical term for these abnormal blood clots is venous thromboembolism (VTE).  What do you need to know about xarelto ? Take your Xarelto ONCE DAILY at the same time every day. You may take it either with or without food.  If you have difficulty swallowing the tablet whole, you may crush it and mix in applesauce just prior to taking your dose.  Take Xarelto exactly as prescribed by your doctor and DO NOT stop taking Xarelto without talking to the doctor who prescribed the medication.  Stopping without other VTE prevention medication to take the place of Xarelto may increase your risk of developing a clot.  After discharge, you  should have regular check-up appointments with your healthcare provider that is prescribing your Xarelto.    What do you do if you miss a dose? If you  miss a dose, take it as soon as you remember on the same day then continue your regularly scheduled once daily regimen the next day. Do not take two doses of Xarelto on the same day.   Important Safety Information A possible side effect of Xarelto is bleeding. You should call your healthcare provider right away if you experience any of the following: Bleeding from an injury or your nose that does not stop. Unusual colored urine (red or dark brown) or unusual colored stools (red or black). Unusual bruising for unknown reasons. A serious fall or if you hit your head (even if there is no bleeding).  Some medicines may interact with Xarelto and might increase your risk of bleeding while on Xarelto. To help avoid this, consult your healthcare provider or pharmacist prior to using any new prescription or non-prescription medications, including herbals, vitamins, non-steroidal anti-inflammatory drugs (NSAIDs) and supplements.  This website has more information on Xarelto: VisitDestination.com.br.

## 2023-04-01 NOTE — Evaluation (Signed)
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 PMH: breast CA, appy, hypothyroidism  Clinical Impression  Pt is s/p TKA resulting in the deficits listed below (see PT Problem List).  Pt amb ~ 19' with RW and min-mod assist, limited by decr quad activation and Knee buckling in stance as well as N/V Anticipate steady progress once issues related to anesthesia  resolve.  Pt will benefit from acute skilled PT to increase their independence and safety with mobility to allow discharge.         Recommendations for follow up therapy are one component of a multi-disciplinary discharge planning process, led by the attending physician.  Recommendations may be updated based on patient status, additional functional criteria and insurance authorization.  Follow Up Recommendations       Assistance Recommended at Discharge Intermittent Supervision/Assistance  Patient can return home with the following  A little help with walking and/or transfers;A little help with bathing/dressing/bathroom;Assistance with cooking/housework;Assist for transportation;Help with stairs or ramp for entrance    Equipment Recommendations Rolling walker (2 wheels) (petite ht)  Recommendations for Other Services       Functional Status Assessment Patient has had a recent decline in their functional status and demonstrates the ability to make significant improvements in function in a reasonable and predictable amount of time.     Precautions / Restrictions Precautions Precautions: Fall;Knee Required Braces or Orthoses: Knee Immobilizer - Left Knee Immobilizer - Left: Discontinue once straight leg raise with < 10 degree lag Restrictions Weight Bearing Restrictions: No Other Position/Activity Restrictions: WBAT      Mobility  Bed Mobility Overal bed mobility: Needs  Assistance Bed Mobility: Supine to Sit     Supine to sit: Min guard     General bed mobility comments: for safety    Transfers Overall transfer level: Needs assistance Equipment used: Rolling walker (2 wheels) Transfers: Sit to/from Stand Sit to Stand: Min assist           General transfer comment: cues for hand placement and LLE position    Ambulation/Gait Ambulation/Gait assistance: Min assist, Mod assist Gait Distance (Feet): 15 Feet Assistive device: Rolling walker (2 wheels) Gait Pattern/deviations: Step-to pattern       General Gait Details: knee buckling- decr quad activation LLE in stance; cues for use of UEs, sequence and staying with step to pattern  Stairs            Wheelchair Mobility    Modified Rankin (Stroke Patients Only)       Balance Overall balance assessment: Needs assistance         Standing balance support: Reliant on assistive device for balance, During functional activity Standing balance-Leahy Scale: Poor                               Pertinent Vitals/Pain Pain Assessment Pain Assessment: 0-10 Pain Score: 6  Pain Location: L  knee Pain Descriptors / Indicators: Aching, Burning, Grimacing, Sore Pain Intervention(s): Limited activity within patient's tolerance, Monitored during session, Premedicated before session, Repositioned    Home Living Family/patient expects to be discharged to:: Private residence Living Arrangements: Spouse/significant other Available Help at Discharge: Family Type of Home: House Home Access: Stairs to enter Entrance Stairs-Rails: None Entrance Stairs-Number of Steps: 2 + 1 or back 2 steps with grab bar -  back   Home Layout: One level Home Equipment: None      Prior Function                       Hand Dominance        Extremity/Trunk Assessment   Upper Extremity Assessment Upper Extremity Assessment: Overall WFL for tasks assessed    Lower Extremity  Assessment Lower Extremity Assessment: LLE deficits/detail LLE Deficits / Details: ankle WFL, knee and hip grossly 3/5--able to SLR with<10degree lag, knee buckling with WBing/decr functional quad activation       Communication      Cognition Arousal/Alertness: Awake/alert Behavior During Therapy: WFL for tasks assessed/performed Overall Cognitive Status: Within Functional Limits for tasks assessed                                          General Comments      Exercises Total Joint Exercises Ankle Circles/Pumps: AROM, Both, 10 reps Quad Sets: AROM, Both, 5 reps Heel Slides: AROM, Left, 5 reps Straight Leg Raises: AROM, Left, 5 reps   Assessment/Plan    PT Assessment Patient needs continued PT services  PT Problem List Decreased strength;Decreased range of motion;Decreased activity tolerance;Decreased mobility;Decreased balance;Pain;Decreased knowledge of precautions;Decreased knowledge of use of DME       PT Treatment Interventions DME instruction;Therapeutic exercise;Gait training;Stair training;Functional mobility training;Therapeutic activities;Patient/family education    PT Goals (Current goals can be found in the Care Plan section)  Acute Rehab PT Goals PT Goal Formulation: With patient Time For Goal Achievement: 04/08/23 Potential to Achieve Goals: Good    Frequency 7X/week     Co-evaluation               AM-PAC PT "6 Clicks" Mobility  Outcome Measure Help needed turning from your back to your side while in a flat bed without using bedrails?: A Little Help needed moving from lying on your back to sitting on the side of a flat bed without using bedrails?: A Little Help needed moving to and from a bed to a chair (including a wheelchair)?: A Little Help needed standing up from a chair using your arms (e.g., wheelchair or bedside chair)?: A Little Help needed to walk in hospital room?: A Lot Help needed climbing 3-5 steps with a railing? :  Total 6 Click Score: 15    End of Session Equipment Utilized During Treatment: Gait belt Activity Tolerance: Patient tolerated treatment well Patient left: with call bell/phone within reach;in chair;with chair alarm set;with nursing/sitter in room Nurse Communication: Mobility status PT Visit Diagnosis: Other abnormalities of gait and mobility (R26.89);Difficulty in walking, not elsewhere classified (R26.2)    Time: 4098-1191 PT Time Calculation (min) (ACUTE ONLY): 27 min   Charges:   PT Evaluation $PT Eval Low Complexity: 1 Low PT Treatments $Gait Training: 8-22 mins        Delice Bison, PT  Acute Rehab Dept [facility]) [phone]  04/01/2023   [facility] 04/01/2023, 3:19 PM

## 2023-04-01 NOTE — Care Plan (Signed)
Ortho Bundle Case Management Note  Patient Details  Name: Danielle Ritter MRN: [id number] Date of Birth: [birth year]                  L TKA on 03/31/26.  DCP: Home with husband.  DME: Youth size RW ordered through [employer].  PT: Kevan Ny 6/20   DME Arranged:  Dan Humphreys rolling DME Agency:  [employer]    Additional Comments: Please contact me with any questions of if this plan should need to change.    Despina Pole, Case Manager  [facility]  [phone] 04/01/2023, 11:09 AM

## 2023-04-01 NOTE — Plan of Care (Signed)
  Problem: Education: Goal: Knowledge of the prescribed therapeutic regimen will improve Outcome: Progressing   Problem: Activity: Goal: Range of joint motion will improve Outcome: Progressing   Problem: Pain Management: Goal: Pain level will decrease with appropriate interventions Outcome: Progressing   Problem: Safety: Goal: Ability to remain free from injury will improve Outcome: Progressing   

## 2023-04-02 ENCOUNTER — Other Ambulatory Visit (HOSPITAL_COMMUNITY): Payer: Medicare Other

## 2023-04-02 ENCOUNTER — Encounter (HOSPITAL_COMMUNITY): Payer: Self-pay | Admitting: Orthopedic Surgery

## 2023-04-02 DIAGNOSIS — E039 Hypothyroidism, unspecified: Secondary | ICD-10-CM | POA: Diagnosis not present

## 2023-04-02 DIAGNOSIS — Z853 Personal history of malignant neoplasm of breast: Secondary | ICD-10-CM | POA: Diagnosis not present

## 2023-04-02 DIAGNOSIS — Z91040 Latex allergy status: Secondary | ICD-10-CM | POA: Diagnosis not present

## 2023-04-02 DIAGNOSIS — Z79899 Other long term (current) drug therapy: Secondary | ICD-10-CM | POA: Diagnosis not present

## 2023-04-02 DIAGNOSIS — M1712 Unilateral primary osteoarthritis, left knee: Secondary | ICD-10-CM | POA: Diagnosis not present

## 2023-04-02 LAB — CBC
HCT: 35 % — ABNORMAL LOW (ref 36.0–46.0)
Hemoglobin: 11.7 g/dL — ABNORMAL LOW (ref 12.0–15.0)
MCH: 31.4 pg (ref 26.0–34.0)
MCHC: 33.4 g/dL (ref 30.0–36.0)
MCV: 93.8 fL (ref 80.0–100.0)
Platelets: 161 10*3/uL (ref 150–400)
RBC: 3.73 MIL/uL — ABNORMAL LOW (ref 3.87–5.11)
RDW: 13 % (ref 11.5–15.5)
WBC: 10.9 10*3/uL — ABNORMAL HIGH (ref 4.0–10.5)
nRBC: 0 % (ref 0.0–0.2)

## 2023-04-02 LAB — BASIC METABOLIC PANEL
Anion gap: 6 (ref 5–15)
BUN: 11 mg/dL (ref 8–23)
CO2: 25 mmol/L (ref 22–32)
Calcium: 8.5 mg/dL — ABNORMAL LOW (ref 8.9–10.3)
Chloride: 103 mmol/L (ref 98–111)
Creatinine, Ser: 0.68 mg/dL (ref 0.44–1.00)
GFR, Estimated: >60 mL/min (ref >60)
Glucose, Bld: 139 mg/dL — ABNORMAL HIGH (ref 70–99)
Potassium: 3.8 mmol/L (ref 3.5–5.1)
Sodium: 134 mmol/L — ABNORMAL LOW (ref 135–145)

## 2023-04-02 MED ORDER — ONDANSETRON HCL 4 MG PO TABS: 4.0000 mg | ORAL_TABLET | Freq: Four times a day (QID) | ORAL | 0 refills | Status: DC | PRN

## 2023-04-02 MED ORDER — RIVAROXABAN 10 MG PO TABS: 10.0000 mg | ORAL_TABLET | Freq: Every day | ORAL | 0 refills | Status: AC

## 2023-04-02 MED ORDER — OXYCODONE HCL 5 MG PO TABS: 5.0000 mg | ORAL_TABLET | Freq: Three times a day (TID) | ORAL | 0 refills | Status: DC | PRN

## 2023-04-02 MED ORDER — METHOCARBAMOL 500 MG PO TABS: 500.0000 mg | ORAL_TABLET | Freq: Four times a day (QID) | ORAL | 0 refills | Status: DC | PRN

## 2023-04-02 MED ORDER — TRAMADOL HCL 50 MG PO TABS: 50.0000 mg | ORAL_TABLET | Freq: Four times a day (QID) | ORAL | 0 refills | Status: DC | PRN

## 2023-04-02 MED ORDER — SODIUM CHLORIDE 0.9 % IV BOLUS
500.0000 mL | Freq: Once | INTRAVENOUS | Status: AC
  Administered 2023-04-02: 500 mL via INTRAVENOUS

## 2023-04-02 NOTE — Care Management Obs Status (Signed)
MEDICARE OBSERVATION STATUS NOTIFICATION   Patient Details  Name: Danielle Ritter MRN: [id number] Date of Birth: Nov 06, 1945   Medicare Observation Status Notification Given:       Ewing Schlein, [place] 04/02/2023, 9:47 AM

## 2023-04-02 NOTE — TOC Transition Note (Signed)
Transition of Care [facility]) - CM/SW Discharge Note  Patient Details  Name: Danielle Ritter MRN: [id number] Date of Birth: 1946/02/05  [facility] CM/SW Contact:  Ewing Schlein, LCSW Phone Number: 04/02/2023, 10:22 AM  Clinical Narrative: Patient is expected to discharge home after working with PT. CSW met with patient to confirm discharge plan and needs. Patient will go home with OPPT at [facility]. Patient will need a youth rolling walker, which [employer] delivered to patient's room. TOC signing off.  Final next level of care: OP Rehab Barriers to Discharge: No Barriers Identified  Patient Goals and CMS Choice CMS Medicare.gov Compare Post Acute Care list provided to:: Patient Choice offered to / list presented to : Patient  Discharge Plan and Services Additional resources added to the After Visit Summary for           DME Arranged: Walker youth DME Agency: [employer] Representative spoke with at DME Agency: Prearranged in orthopedist's office  Social Determinants of Health (SDOH) Interventions SDOH Screenings   Food Insecurity: No Food Insecurity (04/01/2023)  Housing: Low Risk  (04/01/2023)  Transportation Needs: No Transportation Needs (04/01/2023)  Utilities: Not At Risk (04/01/2023)  Tobacco Use: Low Risk  (04/01/2023)   Readmission Risk Interventions     No data to display

## 2023-04-02 NOTE — Plan of Care (Signed)

## 2023-04-02 NOTE — Progress Notes (Signed)
Physical Therapy Treatment Patient Details Name: Danielle Ritter MRN: [id number] DOB: [birth year] Today's Date: 04/02/2023   History of Present Illness 77 yo female s/p L TKA on 04/01/23. PMH: breast CA, appy, hypothyroidism    PT Comments    Pt progressing well. Will see again to review stairs with pt and husband, will likely be able to d/c later today   Recommendations for follow up therapy are one component of a multi-disciplinary discharge planning process, led by the attending physician.  Recommendations may be updated based on patient status, additional functional criteria and insurance authorization.  Follow Up Recommendations       Assistance Recommended at Discharge Intermittent Supervision/Assistance  Patient can return home with the following A little help with walking and/or transfers;A little help with bathing/dressing/bathroom;Assistance with cooking/housework;Assist for transportation;Help with stairs or ramp for entrance   Equipment Recommendations       Recommendations for Other Services       Precautions / Restrictions Precautions Precautions: Fall;Knee Precaution Comments: IND SLRs Required Braces or Orthoses: Knee Immobilizer - Left Knee Immobilizer - Left: Discontinue once straight leg raise with < 10 degree lag Restrictions Weight Bearing Restrictions: No Other Position/Activity Restrictions: WBAT     Mobility  Bed Mobility Overal bed mobility: Needs Assistance Bed Mobility: Supine to Sit, Sit to Supine     Supine to sit: Supervision Sit to supine: Supervision   General bed mobility comments: for safety    Transfers Overall transfer level: Needs assistance Equipment used: Rolling walker (2 wheels) Transfers: Sit to/from Stand Sit to Stand: Min guard, Supervision           General transfer comment: cues for hand placement and LLE position, and safety-cautioned pt not to stand prior to having RW in front of her     Ambulation/Gait Ambulation/Gait assistance: Supervision, Min guard Gait Distance (Feet): 80 Feet Assistive device: Rolling walker (2 wheels) Gait Pattern/deviations: Step-to pattern       General Gait Details: cues for sequence, no knee buckling, no LOB   Stairs             Wheelchair Mobility    Modified Rankin (Stroke Patients Only)       Balance           Standing balance support: Reliant on assistive device for balance, During functional activity Standing balance-Leahy Scale: Poor (posterior LOB with initial standing)                              Cognition Arousal/Alertness: Awake/alert Behavior During Therapy: WFL for tasks assessed/performed Overall Cognitive Status: Within Functional Limits for tasks assessed                                          Exercises Total Joint Exercises Ankle Circles/Pumps: AROM, Both, 10 reps Quad Sets: AROM, Both, 5 reps Heel Slides: AROM, AAROM, Left, 10 reps Straight Leg Raises: AROM, 10 reps, Left, Strengthening    General Comments        Pertinent Vitals/Pain Pain Assessment Pain Assessment: 0-10 Pain Score: 3  Pain Location: L  knee Pain Descriptors / Indicators: Aching, Sore Pain Intervention(s): Limited activity within patient's tolerance, Monitored during session, Premedicated before session, Repositioned    Home Living  Prior Function            PT Goals (current goals can now be found in the care plan section) Acute Rehab PT Goals PT Goal Formulation: With patient Time For Goal Achievement: 04/08/23 Potential to Achieve Goals: Good Progress towards PT goals: Progressing toward goals    Frequency    7X/week      PT Plan Current plan remains appropriate    Co-evaluation              AM-PAC PT "6 Clicks" Mobility   Outcome Measure  Help needed turning from your back to your side while in a flat bed without using  bedrails?: A Little Help needed moving from lying on your back to sitting on the side of a flat bed without using bedrails?: None Help needed moving to and from a bed to a chair (including a wheelchair)?: A Little Help needed standing up from a chair using your arms (e.g., wheelchair or bedside chair)?: A Little Help needed to walk in hospital room?: A Little   6 Click Score: 16    End of Session Equipment Utilized During Treatment: Gait belt Activity Tolerance: Patient tolerated treatment well Patient left: in bed;with call bell/phone within reach;with bed alarm set;with family/visitor present Nurse Communication: Mobility status PT Visit Diagnosis: Other abnormalities of gait and mobility (R26.89);Difficulty in walking, not elsewhere classified (R26.2)     Time: 1610-9604 PT Time Calculation (min) (ACUTE ONLY): 14 min  Charges:  $Gait Training: 8-22 mins                     Delice Bison, PT  Acute Rehab Dept [facility]) [phone]  04/02/2023    [facility] 04/02/2023, 1:35 PM

## 2023-04-02 NOTE — Progress Notes (Signed)
PT TX NOTE   04/02/23 1338  PT Visit Information  Last PT Received On 04/02/23  Assistance Needed Excellent progress, meeting goals and feels ready to d/c home.  History of Present Illness 77 yo female s/p L TKA on 04/01/23. PMH: breast CA, appy, hypothyroidism  Precautions  Precautions Fall;Knee  Precaution Comments IND SLRs  Required Braces or Orthoses Knee Immobilizer - Left  Knee Immobilizer - Left Discontinue once straight leg raise with < 10 degree lag  Restrictions  Weight Bearing Restrictions No  Other Position/Activity Restrictions WBAT  Pain Assessment  Pain Assessment 0-10  Pain Score 3  Pain Location L  knee  Pain Descriptors / Indicators Aching;Sore  Pain Intervention(s) Limited activity within patient's tolerance;Monitored during session;Premedicated before session  Cognition  Arousal/Alertness Awake/alert  Behavior During Therapy WFL for tasks assessed/performed  Overall Cognitive Status Within Functional Limits for tasks assessed  Bed Mobility  Overal bed mobility Needs Assistance  Bed Mobility Supine to Sit;Sit to Supine  Supine to sit Supervision  Sit to supine Supervision  General bed mobility comments for safety  Transfers  Overall transfer level Needs assistance  Equipment used Rolling walker (2 wheels)  Transfers Sit to/from Stand  Sit to Stand Min guard;Supervision  General transfer comment cues for hand placement and LLE position, and safety-cautioned pt not to stand prior to having RW in front of her  Ambulation/Gait  Ambulation/Gait assistance Supervision;Min guard  Gait Distance (Feet) 60 Feet  Assistive device Rolling walker (2 wheels)  Gait Pattern/deviations Step-to pattern  General Gait Details cues for sequence, no knee buckling, no LOB  Stairs Yes  Stairs assistance Min assist;Min guard  Stair Management No rails;Two rails;Forwards;Step to pattern;With walker  Number of Stairs 2  General stair comments cues for technique and safety. assist  to steady and place RW; reviewed with pt husband however husb declined to obs; no knee buckling or LOB  Balance  Standing balance support Reliant on assistive device for balance;During functional activity  Standing balance-Leahy Scale Poor (posterior LOB with initial standing)  Total Joint Exercises  Ankle Circles/Pumps AROM;Both;10 reps  Quad Sets AROM;Both;5 reps  Heel Slides AROM;AAROM;Left;10 reps  Straight Leg Raises AROM;10 reps;Left;Strengthening  PT - End of Session  Equipment Utilized During Treatment Gait belt  Activity Tolerance Patient tolerated treatment well  Patient left in bed;with call bell/phone within reach;with bed alarm set;with family/visitor present  Nurse Communication Mobility status   PT - Assessment/Plan  PT Plan Current plan remains appropriate  PT Visit Diagnosis Other abnormalities of gait and mobility (R26.89);Difficulty in walking, not elsewhere classified (R26.2)  PT Frequency (ACUTE ONLY) 7X/week  Follow Up Recommendations Follow physician's recommendations for discharge plan and follow up therapies  Assistance recommended at discharge Intermittent Supervision/Assistance  Patient can return home with the following A little help with walking and/or transfers;A little help with bathing/dressing/bathroom;Assistance with cooking/housework;Assist for transportation;Help with stairs or ramp for entrance  AM-PAC PT "6 Clicks" Mobility Outcome Measure (Version 2)  Help needed turning from your back to your side while in a flat bed without using bedrails? 3  Help needed moving from lying on your back to sitting on the side of a flat bed without using bedrails? 4  Help needed moving to and from a bed to a chair (including a wheelchair)? 3  Help needed standing up from a chair using your arms (e.g., wheelchair or bedside chair)? 3  Help needed to walk in hospital room? 3  6 Click Score 16  Consider  Recommendation of Discharge To: Home with [facility]  Progressive Mobility   Activity Ambulated with assistance to bathroom  PT Goal Progression  Progress towards PT goals Progressing toward goals  Acute Rehab PT Goals  PT Goal Formulation With patient  Time For Goal Achievement 04/08/23  Potential to Achieve Goals Good  PT Time Calculation  PT Start Time (ACUTE ONLY) 1340  PT Stop Time (ACUTE ONLY) 1353  PT Time Calculation (min) (ACUTE ONLY) 13 min  PT General Charges  $$ ACUTE PT VISIT 1 Visit  PT Treatments  $Gait Training 8-22 mins

## 2023-04-02 NOTE — Progress Notes (Addendum)
   Subjective: 1 Day Post-Op Procedure(s) (LRB): TOTAL KNEE ARTHROPLASTY (Left) Patient reports pain as mild.   Patient seen in rounds by Dr. Lequita Halt. Patient had issues with nausea yesterday. Somewhat improved today. If continues, will switch PO pain meds.  Denies chest pain, SOB, or calf pain. Foley catheter removed this AM.  We will continue therapy today, ambulated 15' yesterday.   Objective: Vital signs in last 24 hours: Temp:  [97.6 F (36.4 C)-97.8 F (36.6 C)] 97.8 F (36.6 C) (06/18 0635) Pulse Rate:  [55-83] 76 (06/18 0635) Resp:  [9-21] 16 (06/18 0635) BP: (116-147)/(53-85) 118/53 (06/18 0635) SpO2:  [95 %-100 %] 97 % (06/18 0635)  Intake/Output from previous day:  Intake/Output Summary (Last 24 hours) at 04/02/2023 0731 Last data filed at 04/02/2023 0600 Gross per 24 hour  Intake 3333.49 ml  Output 3300 ml  Net 33.49 ml     Intake/Output this shift: No intake/output data recorded.  Labs: Recent Labs    04/02/23 0331  HGB 11.7*   Recent Labs    04/02/23 0331  WBC 10.9*  RBC 3.73*  HCT 35.0*  PLT 161   Recent Labs    04/02/23 0331  NA 134*  K 3.8  CL 103  CO2 25  BUN 11  CREATININE 0.68  GLUCOSE 139*  CALCIUM 8.5*   No results for input(s): "LABPT", "INR" in the last 72 hours.  Exam: General - Patient is Alert and Oriented Extremity - Neurologically intact Neurovascular intact Sensation intact distally Dorsiflexion/Plantar flexion intact Dressing - dressing C/D/I Motor Function - intact, moving foot and toes well on exam.   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   Weight as of this encounter: 67 kg. Advance diet Up with therapy D/C IV fluids   Patient's anticipated LOS is less than 2 midnights, meeting these requirements: - Lives within 1 hour of care - Has a competent adult at home to recover with post-op recover - NO history of  - Chronic pain requiring opioids  - Diabetes  - Coronary Artery Disease  - Heart failure  - Heart attack  - Stroke  - Cardiac arrhythmia  - Respiratory Failure/COPD  - Renal failure  - Anemia  - Advanced Liver disease  DVT Prophylaxis - Xarelto Weight bearing as tolerated. Continue therapy.  BP soft this AM with neutral I/O, 500 mL bolus ordered.  Plan is to go Home after hospital stay. Plan for discharge later today if progresses with therapy and nausea controlled. Scheduled for OPPT at [facility]). Follow-up in the office in 2 weeks.  The PDMP database was reviewed today prior to any opioid medications being prescribed to this patient.  Arther Abbott, PA-C Orthopedic Surgery [phone] 04/02/2023, 7:31 AM

## 2023-04-03 NOTE — Discharge Summary (Signed)
Patient ID: Danielle Ritter MRN: [id number] DOB/AGE: [birth year] 77 y.o.  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 female who was admitted 04/01/2023 for operative treatment ofOA (osteoarthritis) of knee. Patient has severe unremitting pain that affects sleep, daily activities, and work/hobbies. After pre-op clearance the patient was taken to the operating room on 04/01/2023 and underwent  Procedure(s): TOTAL KNEE ARTHROPLASTY.    Patient was given perioperative antibiotics:  Anti-infectives (From admission, onward)    Start     Dose/Rate Route Frequency Ordered Stop   04/01/23 1415  ceFAZolin (ANCEF) IVPB 2g/100 mL premix        2 g 200 mL/hr over 30 Minutes Intravenous Every 6 hours 04/01/23 1107 04/01/23 2006   04/01/23 0645  ceFAZolin (ANCEF) IVPB 2g/100 mL premix        2 g 200 mL/hr over 30 Minutes Intravenous On call to O.R. 04/01/23 0630 04/01/23 0825        Patient was given sequential compression devices, early ambulation, and chemoprophylaxis to prevent DVT.  Patient benefited maximally from hospital stay and there were no complications.    Recent vital signs: Patient Vitals for the past 24 hrs:  BP Temp Temp src Pulse Resp SpO2  04/02/23 1333 116/62 97.8 F (36.6 C) Oral 73 17 95 %  04/02/23 1034 134/65 98.2 F (36.8 C) Oral 67 17 100 %     Recent laboratory studies:  Recent Labs    04/02/23 0331  WBC 10.9*  HGB 11.7*  HCT 35.0*  PLT 161  NA 134*  K 3.8  CL 103   CO2 25  BUN 11  CREATININE 0.68  GLUCOSE 139*  CALCIUM 8.5*     Discharge Medications:   Allergies as of 04/02/2023       Reactions   Ciprofloxacin Other (See Comments)   Joint pain   Codeine Nausea And Vomiting   Latex Rash        Medication List     STOP taking these medications    ascorbic acid 500 MG tablet Commonly known as: VITAMIN C   CALCIUM 600-D PO   Cinnamon 500 MG Tabs   Cyanocobalamin 2500 MCG Chew   GLUCOSAMINE-CHONDROITIN PO   Vitamin D3 Ultra Strength 125 MCG (5000 UT) capsule Generic drug: Cholecalciferol   vitamin E 180 MG (400 UNITS) capsule       TAKE these medications    levothyroxine 50 MCG tablet Commonly known as: SYNTHROID Take 50 mcg by mouth daily before breakfast.   loratadine 10 MG tablet Commonly known as: CLARITIN Take 10 mg by mouth daily.   methocarbamol 500 MG tablet Commonly known as: ROBAXIN Take 1 tablet (500 mg total) by mouth every 6 (six) hours as needed for muscle spasms.   omeprazole 20 MG capsule Commonly known as: PRILOSEC Take 20 mg by mouth See admin instructions. Take 20 mg daily, may take a second 20 mg dose as needed for heartburn   ondansetron 4 MG tablet Commonly  known as: ZOFRAN Take 1 tablet (4 mg total) by mouth every 6 (six) hours as needed for nausea.   oxyCODONE 5 MG immediate release tablet Commonly known as: Oxy IR/ROXICODONE Take 1-2 tablets (5-10 mg total) by mouth every 8 (eight) hours as needed for severe pain.   rivaroxaban 10 MG Tabs tablet Commonly known as: XARELTO Take 1 tablet (10 mg total) by mouth daily with breakfast for 20 days. Then take one 81 mg aspirin once a day for three weeks. Then discontinue aspirin.   traMADol 50 MG tablet Commonly known as: ULTRAM Take 1-2 tablets (50-100 mg total) by mouth every 6 (six) hours as needed for moderate pain.               Discharge Care Instructions  (From admission, onward)           Start     Ordered    04/02/23 0000  Weight bearing as tolerated        04/02/23 0735   04/02/23 0000  Change dressing       Comments: You may remove the bulky bandage (ACE wrap and gauze) two days after surgery. You will have an adhesive waterproof bandage underneath. Leave this in place until your first follow-up appointment.   04/02/23 0735            Diagnostic Studies: No results found.  Disposition: Discharge disposition: 01-Home or Self Care       Discharge Instructions     Call MD / Call 911   Complete by: As directed    If you experience chest pain or shortness of breath, CALL 911 and be transported to the hospital emergency room.  If you develope a fever above 101 F, pus (white drainage) or increased drainage or redness at the wound, or calf pain, call your surgeon's office.   Change dressing   Complete by: As directed    You may remove the bulky bandage (ACE wrap and gauze) two days after surgery. You will have an adhesive waterproof bandage underneath. Leave this in place until your first follow-up appointment.   Constipation Prevention   Complete by: As directed    Drink plenty of fluids.  Prune juice may be helpful.  You may use a stool softener, such as Colace (over the counter) 100 mg twice a day.  Use MiraLax (over the counter) for constipation as needed.   Diet - low sodium heart healthy   Complete by: As directed    Do not put a pillow under the knee. Place it under the heel.   Complete by: As directed    Driving restrictions   Complete by: As directed    No driving for two weeks   Post-operative opioid taper instructions:   Complete by: As directed    POST-OPERATIVE OPIOID TAPER INSTRUCTIONS: It is important to wean off of your opioid medication as soon as possible. If you do not need pain medication after your surgery it is ok to stop day one. Opioids include: Codeine, Hydrocodone(Norco, Vicodin), Oxycodone(Percocet, oxycontin) and hydromorphone amongst others.  Long term  and even short term use of opiods can cause: Increased pain response Dependence Constipation Depression Respiratory depression And more.  Withdrawal symptoms can include Flu like symptoms Nausea, vomiting And more Techniques to manage these symptoms Hydrate well Eat regular healthy meals Stay active Use relaxation techniques(deep breathing, meditating, yoga) Do Not substitute Alcohol to help with tapering If you have been on opioids for less than two  weeks and do not have pain than it is ok to stop all together.  Plan to wean off of opioids This plan should start within one week post op of your joint replacement. Maintain the same interval or time between taking each dose and first decrease the dose.  Cut the total daily intake of opioids by one tablet each day Next start to increase the time between doses. The last dose that should be eliminated is the evening dose.      TED hose   Complete by: As directed    Use stockings (TED hose) for three weeks on both leg(s).  You may remove them at night for sleeping.   Weight bearing as tolerated   Complete by: As directed         Follow-up Information     Ollen Gross, MD. Go on 04/16/2023.   Specialty: Orthopedic Surgery Why: You are scheduled for first post op appt on Tuesday July 2 at 2:45pm. Contact information: [address] [place] [address] [phone]                  Signed: Arther Abbott 04/03/2023, 8:11 AM

## 2023-04-04 DIAGNOSIS — M25562 Pain in left knee: Secondary | ICD-10-CM | POA: Diagnosis not present

## 2023-04-04 DIAGNOSIS — M25662 Stiffness of left knee, not elsewhere classified: Secondary | ICD-10-CM | POA: Diagnosis not present

## 2023-04-08 DIAGNOSIS — M25562 Pain in left knee: Secondary | ICD-10-CM | POA: Diagnosis not present

## 2023-04-08 DIAGNOSIS — M25662 Stiffness of left knee, not elsewhere classified: Secondary | ICD-10-CM | POA: Diagnosis not present

## 2023-04-09 DIAGNOSIS — M25662 Stiffness of left knee, not elsewhere classified: Secondary | ICD-10-CM | POA: Diagnosis not present

## 2023-04-09 DIAGNOSIS — M25562 Pain in left knee: Secondary | ICD-10-CM | POA: Diagnosis not present

## 2023-04-12 DIAGNOSIS — M25662 Stiffness of left knee, not elsewhere classified: Secondary | ICD-10-CM | POA: Diagnosis not present

## 2023-04-12 DIAGNOSIS — M25562 Pain in left knee: Secondary | ICD-10-CM | POA: Diagnosis not present

## 2023-04-15 DIAGNOSIS — M25562 Pain in left knee: Secondary | ICD-10-CM | POA: Diagnosis not present

## 2023-04-15 DIAGNOSIS — M25662 Stiffness of left knee, not elsewhere classified: Secondary | ICD-10-CM | POA: Diagnosis not present

## 2023-04-17 DIAGNOSIS — M25662 Stiffness of left knee, not elsewhere classified: Secondary | ICD-10-CM | POA: Diagnosis not present

## 2023-04-17 DIAGNOSIS — M25562 Pain in left knee: Secondary | ICD-10-CM | POA: Diagnosis not present

## 2023-04-19 DIAGNOSIS — M25562 Pain in left knee: Secondary | ICD-10-CM | POA: Diagnosis not present

## 2023-04-19 DIAGNOSIS — M25662 Stiffness of left knee, not elsewhere classified: Secondary | ICD-10-CM | POA: Diagnosis not present

## 2023-04-23 DIAGNOSIS — M25562 Pain in left knee: Secondary | ICD-10-CM | POA: Diagnosis not present

## 2023-04-23 DIAGNOSIS — M25662 Stiffness of left knee, not elsewhere classified: Secondary | ICD-10-CM | POA: Diagnosis not present

## 2023-04-25 DIAGNOSIS — M25662 Stiffness of left knee, not elsewhere classified: Secondary | ICD-10-CM | POA: Diagnosis not present

## 2023-04-25 DIAGNOSIS — M25562 Pain in left knee: Secondary | ICD-10-CM | POA: Diagnosis not present

## 2023-04-30 DIAGNOSIS — M25662 Stiffness of left knee, not elsewhere classified: Secondary | ICD-10-CM | POA: Diagnosis not present

## 2023-04-30 DIAGNOSIS — M25562 Pain in left knee: Secondary | ICD-10-CM | POA: Diagnosis not present

## 2023-05-02 DIAGNOSIS — M25562 Pain in left knee: Secondary | ICD-10-CM | POA: Diagnosis not present

## 2023-05-02 DIAGNOSIS — M25662 Stiffness of left knee, not elsewhere classified: Secondary | ICD-10-CM | POA: Diagnosis not present

## 2023-05-07 DIAGNOSIS — M25662 Stiffness of left knee, not elsewhere classified: Secondary | ICD-10-CM | POA: Diagnosis not present

## 2023-05-07 DIAGNOSIS — M25562 Pain in left knee: Secondary | ICD-10-CM | POA: Diagnosis not present

## 2023-05-09 DIAGNOSIS — M25562 Pain in left knee: Secondary | ICD-10-CM | POA: Diagnosis not present

## 2023-05-09 DIAGNOSIS — M25662 Stiffness of left knee, not elsewhere classified: Secondary | ICD-10-CM | POA: Diagnosis not present

## 2023-05-14 DIAGNOSIS — Z5189 Encounter for other specified aftercare: Secondary | ICD-10-CM | POA: Diagnosis not present

## 2023-07-16 DIAGNOSIS — Z23 Encounter for immunization: Secondary | ICD-10-CM | POA: Diagnosis not present

## 2023-07-16 NOTE — H&P (Addendum)
TOTAL KNEE ADMISSION H&P  Patient is being admitted for right total knee arthroplasty.  Subjective:  Chief Complaint: Right knee pain.  HPI: Danielle Ritter, 77 y.o. female has a history of pain and functional disability in the right knee due to arthritis and has failed non-surgical conservative treatments for greater than 12 weeks to include NSAID's and/or analgesics, corticosteriod injections, viscosupplementation injections, and activity modification. Onset of symptoms was gradual, starting several years ago with gradually worsening course since that time. The patient noted no past surgery on the right knee.  Patient currently rates pain in the right knee at 8 out of 10 with activity. Patient has night pain, worsening of pain with activity and weight bearing, pain that interferes with activities of daily living, and pain with passive range of motion. Patient has evidence of  bone-on-bone changes in the medial and patellofemoral compartments  by imaging studies. There is no active infection.  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/04/2022   Ankle swelling 06/07/2016   High cholesterol 06/07/2016   Gallstones 06/07/2016   Gastroesophageal reflux disease without esophagitis 06/07/2016    Past Medical History:  Diagnosis Date   Allergies    Arthritis    Cancer (HCC) 05/2022   right breast DCIS   GERD (gastroesophageal reflux disease)    Hypothyroidism     Past Surgical History:  Procedure Laterality Date   ABDOMINAL HYSTERECTOMY     APPENDECTOMY     bladder tact     BREAST LUMPECTOMY WITH RADIOACTIVE SEED LOCALIZATION Right 07/17/2022   Procedure: RIGHT BREAST LUMPECTOMY WITH RADIOACTIVE SEED LOCALIZATION;  Surgeon: Harriette Bouillon, MD;  Location: [facility];  Service: General;  Laterality: Right;   BREAST SURGERY      Nipple revision   CHOLECYSTECTOMY N/A 06/20/2016   Procedure: LAPAROSCOPIC CHOLECYSTECTOMY;  Surgeon: Franky Macho, MD;  Location: [facility];  Service: General;  Laterality: N/A;   ESOPHAGOGASTRODUODENOSCOPY N/A 08/09/2016   Procedure: ESOPHAGOGASTRODUODENOSCOPY (EGD);  Surgeon: Malissa Hippo, MD;  Location: [facility];  Service: Endoscopy;  Laterality: N/A;  3:00 - moved to 10/26 @ 2:25   hystorectomy     TOTAL KNEE ARTHROPLASTY Left 04/01/2023   Procedure: TOTAL KNEE ARTHROPLASTY;  Surgeon: Ollen Gross, MD;  Location: [facility];  Service: Orthopedics;  Laterality: Left;    Prior to Admission medications   Medication Sig Start Date End Date Taking? Authorizing Provider  levothyroxine (SYNTHROID) 50 MCG tablet Take 50 mcg by mouth daily before breakfast.    [provider]  loratadine (CLARITIN) 10 MG tablet Take 10 mg by mouth daily.    [provider]  methocarbamol (ROBAXIN) 500 MG tablet Take 1 tablet (500 mg total) by mouth every 6 (six) hours as needed for muscle spasms. 04/02/23   Edmisten, Lyn Hollingshead, PA  omeprazole (PRILOSEC) 20 MG capsule Take 20 mg by mouth See admin instructions. Take 20 mg daily, may take a second 20 mg dose as needed for heartburn    [provider]  ondansetron (ZOFRAN) 4 MG tablet Take 1 tablet (4 mg total) by mouth every 6 (six) hours as needed for nausea. 04/02/23   Edmisten, Kristie L, PA  oxyCODONE (OXY IR/ROXICODONE) 5 MG immediate release tablet Take 1-2 tablets (5-10 mg total) by mouth every 8 (eight) hours as needed for severe pain. 04/02/23  Edmisten, Kristie L, PA  traMADol (ULTRAM) 50 MG tablet Take 1-2 tablets (50-100 mg total) by mouth every 6 (six) hours as needed for moderate pain. 04/02/23   Edmisten, Kristie L, PA    Allergies  Allergen Reactions   Ciprofloxacin Other (See Comments)    Joint pain   Codeine Nausea And Vomiting   Latex Rash    Social History   Socioeconomic History   Marital status: Married     Spouse name: Not on file   Number of children: Not on file   Years of education: Not on file   Highest education level: Not on file  Occupational History   Not on file  Tobacco Use   Smoking status: Never   Smokeless tobacco: Never  Vaping Use   Vaping status: Never Used  Substance and Sexual Activity   Alcohol use: No   Drug use: No   Sexual activity: Not Currently  Other Topics Concern   Not on file  Social History Narrative   Not on file   Social Determinants of Health   Financial Resource Strain: Not on file  Food Insecurity: No Food Insecurity (04/01/2023)   Hunger Vital Sign    Worried About Running Out of Food in the Last Year: Never true    Ran Out of Food in the Last Year: Never true  Transportation Needs: No Transportation Needs (04/01/2023)   PRAPARE - Administrator, Civil Service (Medical): No    Lack of Transportation (Non-Medical): No  Physical Activity: Not on file  Stress: Not on file  Social Connections: Not on file  Intimate Partner Violence: Not At Risk (04/01/2023)   Humiliation, Afraid, Rape, and Kick questionnaire    Fear of Current or Ex-Partner: No    Emotionally Abused: No    Physically Abused: No    Sexually Abused: No    Tobacco Use: Low Risk  (04/01/2023)   Patient History    Smoking Tobacco Use: Never    Smokeless Tobacco Use: Never    Passive Exposure: Not on file   Social History   Substance and Sexual Activity  Alcohol Use No    Family History  Problem Relation Age of Onset   Emphysema Mother    Hodgkin's lymphoma Father    Heart failure Sister    Ovarian cancer Sister    Diabetes Brother    Prostate cancer Brother     Review of Systems  Constitutional:  Negative for chills and fever.  HENT: Negative.    Eyes: Negative.   Respiratory:  Negative for cough and shortness of breath.   Cardiovascular:  Negative for chest pain and palpitations.  Gastrointestinal:  Negative for abdominal pain, nausea and vomiting.   Genitourinary:  Negative for dysuria, frequency and urgency.  Musculoskeletal:  Positive for joint pain.  Skin:  Negative for rash.   Objective:  Physical Exam: Well nourished and well developed.  General: Alert and oriented x3, cooperative and pleasant, no acute distress.  Head: normocephalic, atraumatic, neck supple.  Eyes: EOMI. Abdomen: non-tender to palpation and soft, normoactive bowel sounds. Musculoskeletal: - Right knee No effusion, range of motion approximately 0 to 130, moderate crepitus on range of motion, tenderness medially and laterally, no instability. Calves soft and nontender. Motor function intact in LE. Strength 5/5 LE bilaterally. Neuro: Distal pulses 2+. Sensation to light touch intact in LE.  Vital signs in last 24 hours: BP: ()/()  Arterial Line BP: ()/()   Imaging Review Plain radiographs  demonstrate moderate degenerative joint disease of the right knee. The overall alignment is neutral. The bone quality appears to be adequate for age and reported activity level.  Assessment/Plan:  End stage arthritis, right knee   The patient history, physical examination, clinical judgment of the provider and imaging studies are consistent with end stage degenerative joint disease of the right knee and total knee arthroplasty is deemed medically necessary. The treatment options including medical management, injection therapy arthroscopy and arthroplasty were discussed at length. The risks and benefits of total knee arthroplasty were presented and reviewed. The risks due to aseptic loosening, infection, stiffness, patella tracking problems, thromboembolic complications and other imponderables were discussed. The patient acknowledged the explanation, agreed to proceed with the plan and consent was signed. Patient is being admitted for inpatient treatment for surgery, pain control, PT, OT, prophylactic antibiotics, VTE prophylaxis, progressive ambulation and ADLs and discharge  planning. The patient is planning to be discharged  home .  Patient's anticipated LOS is less than 2 midnights, meeting these requirements: - Lives within 1 hour of care - Has a competent adult at home to recover with post-op - NO history of  - Chronic pain requiring opiods  - Diabetes  - Coronary Artery Disease  - Heart failure  - Heart attack  - Stroke  - Cardiac arrhythmia  - Respiratory Failure/COPD  - Renal failure  - Anemia  - Advanced Liver disease  Therapy Plans: EO [place]  Disposition: Home with Husband  Planned DVT Prophylaxis: Xarelto 10 mg QD (hx breast cancer) DME Needed: None  PCP: Assunta Found, MD (clearance received)  TXA: IV  Allergies: codeine (nausea)  Anesthesia Concerns: N/V BMI: 29.1  Last HgbA1c: Not diabetic  Pharmacy: [facility])    Other:  - 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 Will plan for IV TXA.  - tolerated oxycodone and tramadol prior to surgery  - Patient was instructed on what medications to stop prior to surgery. - Follow-up visit in 2 weeks with Dr. Lequita Halt - Begin physical therapy following surgery - Pre-operative lab work as pre-surgical testing - Prescriptions will be provided in hospital at time of discharge  R. Arcola Jansky, PA-C Orthopedic Surgery [facility]

## 2023-07-24 DIAGNOSIS — E559 Vitamin D deficiency, unspecified: Secondary | ICD-10-CM | POA: Diagnosis not present

## 2023-07-24 DIAGNOSIS — Z01818 Encounter for other preprocedural examination: Secondary | ICD-10-CM | POA: Diagnosis not present

## 2023-07-24 DIAGNOSIS — E782 Mixed hyperlipidemia: Secondary | ICD-10-CM | POA: Diagnosis not present

## 2023-07-24 DIAGNOSIS — R7303 Prediabetes: Secondary | ICD-10-CM | POA: Diagnosis not present

## 2023-07-24 DIAGNOSIS — E039 Hypothyroidism, unspecified: Secondary | ICD-10-CM | POA: Diagnosis not present

## 2023-07-24 DIAGNOSIS — M810 Age-related osteoporosis without current pathological fracture: Secondary | ICD-10-CM | POA: Diagnosis not present

## 2023-07-24 DIAGNOSIS — E7849 Other hyperlipidemia: Secondary | ICD-10-CM | POA: Diagnosis not present

## 2023-07-24 DIAGNOSIS — Z0001 Encounter for general adult medical examination with abnormal findings: Secondary | ICD-10-CM | POA: Diagnosis not present

## 2023-07-30 NOTE — Patient Instructions (Signed)
SURGICAL WAITING ROOM VISITATION Patients having surgery or a procedure may have no more than 2 support people in the waiting area - these visitors may rotate in the visitor waiting room.   Due to an increase in RSV and influenza rates and associated hospitalizations, children ages 20 and under may not visit patients in [facility] hospitals. If the patient needs to stay at the hospital during part of their recovery, the visitor guidelines for inpatient rooms apply.  PRE-OP VISITATION  Pre-op nurse will coordinate an appropriate time for 1 support person to accompany the patient in pre-op.  This support person may not rotate.  This visitor will be contacted when the time is appropriate for the visitor to come back in the pre-op area.  Please refer to the [facility] website for the visitor guidelines for Inpatients (after your surgery is over and you are in a regular room).  You are not required to quarantine at this time prior to your surgery. However, you must do this: Hand Hygiene often Do NOT share personal items Notify your provider if you are in close contact with someone who has COVID or you develop fever 100.4 or greater, new onset of sneezing, cough, sore throat, shortness of breath or body aches.  If you test positive for Covid or have been in contact with anyone that has tested positive in the last 10 days please notify you surgeon.    Your procedure is scheduled on:  Monday  August 12, 2023  Report to [facility] Main Entrance: [place] entrance where the [employer] is available.   Report to admitting at: 05:15    AM  Call this number if you have any questions or problems the morning of surgery [phone]  Do not eat food after Midnight the night prior to your surgery/procedure.  After Midnight you may have the following liquids until  04:15 AM DAY OF SURGERY  Clear Liquid Diet Water Black Coffee (sugar ok, NO MILK/CREAM OR CREAMERS)  Tea (sugar ok, NO  MILK/CREAM OR CREAMERS) regular and decaf                             Plain Jell-O  with no fruit (NO RED)                                           Fruit ices (not with fruit pulp, NO RED)                                     Popsicles (NO RED)                                                                  Juice: NO CITRUS JUICES: only apple, WHITE grape, WHITE cranberry Sports drinks like Gatorade or Powerade (NO RED)                   The day of surgery:  Drink ONE (1) Pre-Surgery Clear Ensure at    04:15    AM  the morning of surgery. Drink in one sitting. Do not sip.  This drink was given to you during your hospital pre-op appointment visit. Nothing else to drink after completing the Pre-Surgery Clear Ensure : No candy, chewing gum or throat lozenges.    FOLLOW ANY ADDITIONAL PRE OP INSTRUCTIONS YOU RECEIVED FROM YOUR SURGEON'S OFFICE!!!   Oral Hygiene is also important to reduce your risk of infection.        Remember - BRUSH YOUR TEETH THE MORNING OF SURGERY WITH YOUR REGULAR TOOTHPASTE  Do NOT smoke after Midnight the night before surgery.  STOP TAKING all Vitamins, Herbs and supplements 1 week before your surgery.   Take ONLY these medicines the morning of surgery with A SIP OF WATER: omeprazole (Prilosec), Levothyroxine (Synthroid), Loratadine (Claritin)                   You may not have any metal on your body including hair pins, jewelry, and body piercing  Do not wear make-up, lotions, powders, perfumes / cologne, or deodorant  Do not wear nail polish including gel and S&S, artificial / acrylic nails, or any other type of covering on natural nails including finger and toenails. If you have artificial nails, gel coating, etc., that needs to be removed by a nail salon, Please have this removed prior to surgery. Not doing so may mean that your surgery could be cancelled or delayed if the Surgeon or anesthesia staff feels like they are unable to monitor you safely.   Do not  shave 48 hours prior to surgery to avoid nicks in your skin which may contribute to postoperative infections.   Contacts, Hearing Aids, dentures or bridgework may not be worn into surgery. DENTURES WILL BE REMOVED PRIOR TO SURGERY PLEASE DO NOT APPLY "Poly grip" OR ADHESIVES!!!  You may bring a small overnight bag with you on the day of surgery, only pack items that are not valuable. [facility] IS NOT RESPONSIBLE   FOR VALUABLES THAT ARE LOST OR STOLEN.   Do not bring your home medications to the hospital. The Pharmacy will dispense medications listed on your medication list to you during your admission in the Hospital.  Special Instructions: Bring a copy of your healthcare power of attorney and living will documents the day of surgery, if you wish to have them scanned into your [facility]- EPIC  Please read over the following fact sheets you were given: IF YOU HAVE QUESTIONS ABOUT YOUR PRE-OP INSTRUCTIONS, PLEASE CALL [phone].     Pre-operative 5 CHG Bath Instructions   You can play a key role in reducing the risk of infection after surgery. Your skin needs to be as free of germs as possible. You can reduce the number of germs on your skin by washing with CHG (chlorhexidine gluconate) soap before surgery. CHG is an antiseptic soap that kills germs and continues to kill germs even after washing.   DO NOT use if you have an allergy to chlorhexidine/CHG or antibacterial soaps. If your skin becomes reddened or irritated, stop using the CHG and notify one of our RNs at [phone]  Please shower with the CHG soap starting 4 days before surgery using the following schedule: START SHOWERS ON   THURSDAY  August 08, 2023  Please keep in mind the following:  DO NOT shave, including legs and  underarms, starting the day of your first shower.   You may shave your face at any point before/day of surgery.   Place clean sheets on your bed the day you start using CHG soap. Use a clean washcloth (not used since being washed) for each shower. DO NOT sleep with pets once you start using the CHG.   CHG Shower Instructions:  If you choose to wash your hair and private area, wash first with your normal shampoo/soap.  After you use shampoo/soap, rinse your hair and body thoroughly to remove shampoo/soap residue.  Turn the water OFF and apply about 3 tablespoons (45 ml) of CHG soap to a CLEAN washcloth.  Apply CHG soap ONLY FROM YOUR NECK DOWN TO YOUR TOES (washing for 3-5 minutes)  DO NOT use CHG soap on face, private areas, open wounds, or sores.  Pay special attention to the area where your surgery is being performed.  If you are having back surgery, having someone wash your back for you may be helpful.  Wait 2 minutes after CHG soap is applied, then you may rinse off the CHG soap.  Pat dry with a clean towel  Put on clean clothes/pajamas   If you choose to wear lotion, please use ONLY the CHG-compatible lotions on the back of this paper.     Additional instructions for the day of surgery: DO NOT APPLY any lotions, deodorants, cologne, or perfumes.   Put on clean/comfortable clothes.  Brush your teeth.  Ask your nurse before applying any prescription medications to the skin.      CHG Compatible Lotions   Aveeno Moisturizing lotion  Cetaphil Moisturizing Cream  Cetaphil Moisturizing Lotion  Clairol Herbal Essence Moisturizing Lotion, Dry Skin  Clairol Herbal Essence Moisturizing Lotion, Extra Dry Skin  Clairol Herbal Essence Moisturizing Lotion, Normal Skin  Curel Age Defying Therapeutic Moisturizing Lotion with Alpha Hydroxy  Curel Extreme Care Body Lotion  Curel Soothing Hands Moisturizing Hand Lotion  Curel Therapeutic Moisturizing Cream, Fragrance-Free  Curel  Therapeutic Moisturizing Lotion, Fragrance-Free  Curel Therapeutic Moisturizing Lotion, Original Formula  Eucerin Daily Replenishing Lotion  Eucerin Dry Skin Therapy Plus Alpha Hydroxy Crme  Eucerin Dry Skin Therapy Plus Alpha Hydroxy Lotion  Eucerin Original Crme  Eucerin Original Lotion  Eucerin Plus Crme Eucerin Plus Lotion  Eucerin TriLipid Replenishing Lotion  Keri Anti-Bacterial Hand Lotion  Keri Deep Conditioning Original Lotion Dry Skin Formula Softly Scented  Keri Deep Conditioning Original Lotion, Fragrance Free Sensitive Skin Formula  Keri Lotion Fast Absorbing Fragrance Free Sensitive Skin Formula  Keri Lotion Fast Absorbing Softly Scented Dry Skin Formula  Keri Original Lotion  Keri Skin Renewal Lotion Keri Silky Smooth Lotion  Keri Silky Smooth Sensitive Skin Lotion  Nivea Body Creamy Conditioning Oil  Nivea Body Extra Enriched Lotion  Nivea Body Original Lotion  Nivea Body Sheer Moisturizing Lotion Nivea Crme  Nivea Skin Firming Lotion  NutraDerm 30 Skin Lotion  NutraDerm Skin Lotion  NutraDerm Therapeutic Skin Cream  NutraDerm Therapeutic Skin Lotion  ProShield Protective Hand Cream  Provon moisturizing lotion   FAILURE TO FOLLOW THESE INSTRUCTIONS MAY RESULT IN THE CANCELLATION OF YOUR SURGERY  PATIENT SIGNATURE_________________________________  NURSE SIGNATURE__________________________________  ________________________________________________________________________      Danielle Ritter    An incentive spirometer is a tool that can help keep your lungs clear and active. This tool measures how well you are filling your lungs with each breath. Taking long  deep breaths may help reverse or decrease the chance of developing breathing (pulmonary) problems (especially infection) following: A long period of time when you are unable to move or be active. BEFORE THE PROCEDURE  If the spirometer includes an indicator to show your best effort, your nurse  or respiratory therapist will set it to a desired goal. If possible, sit up straight or lean slightly forward. Try not to slouch. Hold the incentive spirometer in an upright position. INSTRUCTIONS FOR USE  Sit on the edge of your bed if possible, or sit up as far as you can in bed or on a chair. Hold the incentive spirometer in an upright position. Breathe out normally. Place the mouthpiece in your mouth and seal your lips tightly around it. Breathe in slowly and as deeply as possible, raising the piston or the ball toward the top of the column. Hold your breath for 3-5 seconds or for as long as possible. Allow the piston or ball to fall to the bottom of the column. Remove the mouthpiece from your mouth and breathe out normally. Rest for a few seconds and repeat Steps 1 through 7 at least 10 times every 1-2 hours when you are awake. Take your time and take a few normal breaths between deep breaths. The spirometer may include an indicator to show your best effort. Use the indicator as a goal to work toward during each repetition. After each set of 10 deep breaths, practice coughing to be sure your lungs are clear. If you have an incision (the cut made at the time of surgery), support your incision when coughing by placing a pillow or rolled up towels firmly against it. Once you are able to get out of bed, walk around indoors and cough well. You may stop using the incentive spirometer when instructed by your caregiver.  RISKS AND COMPLICATIONS Take your time so you do not get dizzy or light-headed. If you are in pain, you may need to take or ask for pain medication before doing incentive spirometry. It is harder to take a deep breath if you are having pain. AFTER USE Rest and breathe slowly and easily. It can be helpful to keep track of a log of your progress. Your caregiver can provide you with a simple table to help with this. If you are using the spirometer at home, follow these  instructions: SEEK MEDICAL CARE IF:  You are having difficultly using the spirometer. You have trouble using the spirometer as often as instructed. Your pain medication is not giving enough relief while using the spirometer. You develop fever of 100.5 F (38.1 C) or higher.                                                                                                    SEEK IMMEDIATE MEDICAL CARE IF:  You cough up bloody sputum that had not been present before. You develop fever of 102 F (38.9 C) or greater. You develop worsening pain at or near the incision site. MAKE SURE YOU:  Understand these instructions. Will watch your condition. Will  get help right away if you are not doing well or get worse. Document Released: 02/11/2007 Document Revised: 12/24/2011 Document Reviewed: 04/14/2007 [facility] Patient Information 2014 [place], [place].

## 2023-07-30 NOTE — Progress Notes (Signed)
COVID Vaccine received:  []  No [x]  Yes Date of any COVID positive Test in last 90 days:  None  PCP - Assunta Found, MD  [phone] (Work) [phone] (Fax)  Cardiologist - no  (Saw Oletta Lamas, MD in 2017 for CP workup, strong family hx of CAD- CABG)  Chest x-ray - 11-22-2021  2v   Epic EKG -  (05-17-2016 Epic)  will repeat Stress Test - 05-29-2016  Epic ECHO -  Cardiac Cath -   PCR screen: [x]  Ordered & Completed []   No Order but Needs PROFEND     []   N/A for this surgery  Surgery Plan:  []  Ambulatory   [x]  Outpatient in bed  []  Admit Anesthesia:    []  General  []  Spinal  [x]   Choice []   MAC  Pacemaker / ICD device [x]  No []  Yes   Spinal Cord Stimulator:[x]  No []  Yes       History of Sleep Apnea? [x]  No []  Yes   CPAP used?- [x]  No []  Yes    Does the patient monitor blood sugar?   [x]  N/A   []  No []  Yes  Patient has: [x]  NO Hx DM   []  Pre-DM   []  DM1  []   DM2  Blood Thinner / Instructions:  none Aspirin Instructions:none  ERAS Protocol Ordered: []  No  [x]  Yes PRE-SURGERY [x]  ENSURE  []  G2   Patient is to be NPO after: 04:15  Dental hx: []  Dentures:  [x]  N/A      []  Bridge or Partial:                   []  Loose or Damaged teeth:   Comments: Patient was given the 5 CHG shower / bath instructions for TKA surgery along with 2 bottles of the CHG soap. Patient will start this on:  Thursday  08-08-2023. All questions were asked and answered, Patient voiced understanding of this process.   Activity level: Patient is able to climb a flight of stairs without difficulty; [x]  No CP  but would have SOB and Leg pain.   Patient can perform ADLs without assistance.   Anesthesia review: GERD, Hx Palpitations, CP- worked up in 2017. S/p Right breast lumpectomy for DCIS, radiation.  Anemia, PONV  Patient denies shortness of breath, fever, cough and chest pain at PAT appointment.  Patient verbalized understanding and agreement to the Pre-Surgical Instructions that were given to them at  this PAT appointment. Patient was also educated of the need to review these PAT instructions again prior to her surgery.I reviewed the appropriate phone numbers to call if they have any and questions or concerns.

## 2023-07-31 ENCOUNTER — Encounter (HOSPITAL_COMMUNITY)
Admission: RE | Admit: 2023-07-31 | Discharge: 2023-07-31 | Disposition: A | Discharge status: Home / Self Care | Payer: Medicare Other | Source: Ambulatory Visit | Attending: Orthopedic Surgery | Admitting: Orthopedic Surgery

## 2023-07-31 ENCOUNTER — Encounter (HOSPITAL_COMMUNITY): Payer: Self-pay

## 2023-07-31 ENCOUNTER — Other Ambulatory Visit: Payer: Self-pay

## 2023-07-31 VITALS — BP 137/60 | HR 78 | Temp 97.9°F | Resp 16 | Ht 60.0 in | Wt 145.0 lb

## 2023-07-31 DIAGNOSIS — Z01812 Encounter for preprocedural laboratory examination: Secondary | ICD-10-CM | POA: Diagnosis present

## 2023-07-31 DIAGNOSIS — Z01818 Encounter for other preprocedural examination: Secondary | ICD-10-CM | POA: Diagnosis not present

## 2023-07-31 DIAGNOSIS — Z01810 Encounter for preprocedural cardiovascular examination: Secondary | ICD-10-CM | POA: Diagnosis present

## 2023-07-31 DIAGNOSIS — Z87898 Personal history of other specified conditions: Secondary | ICD-10-CM | POA: Diagnosis not present

## 2023-07-31 HISTORY — DX: Anemia, unspecified: D64.9

## 2023-07-31 LAB — BASIC METABOLIC PANEL
Anion gap: 7 (ref 5–15)
BUN: 15 mg/dL (ref 8–23)
CO2: 26 mmol/L (ref 22–32)
Calcium: 9.2 mg/dL (ref 8.9–10.3)
Chloride: 102 mmol/L (ref 98–111)
Creatinine, Ser: 0.69 mg/dL (ref 0.44–1.00)
GFR, Estimated: >60 mL/min (ref >60)
Glucose, Bld: 98 mg/dL (ref 70–99)
Potassium: 4.4 mmol/L (ref 3.5–5.1)
Sodium: 135 mmol/L (ref 135–145)

## 2023-07-31 LAB — CBC
HCT: 44 % (ref 36.0–46.0)
Hemoglobin: 14.2 g/dL (ref 12.0–15.0)
MCH: 30.2 pg (ref 26.0–34.0)
MCHC: 32.3 g/dL (ref 30.0–36.0)
MCV: 93.6 fL (ref 80.0–100.0)
Platelets: 217 10*3/uL (ref 150–400)
RBC: 4.7 MIL/uL (ref 3.87–5.11)
RDW: 13.2 % (ref 11.5–15.5)
WBC: 5.6 10*3/uL (ref 4.0–10.5)
nRBC: 0 % (ref 0.0–0.2)

## 2023-07-31 LAB — SURGICAL PCR SCREEN
MRSA, PCR: NEGATIVE
Staphylococcus aureus: NEGATIVE

## 2023-08-12 ENCOUNTER — Ambulatory Visit (HOSPITAL_COMMUNITY): Payer: Medicare Other | Admitting: Anesthesiology

## 2023-08-12 ENCOUNTER — Other Ambulatory Visit: Payer: Self-pay

## 2023-08-12 ENCOUNTER — Ambulatory Visit (HOSPITAL_COMMUNITY)
Admission: RE | Admit: 2023-08-12 | Discharge: 2023-08-13 | Disposition: A | Discharge status: Home / Self Care | Payer: Medicare Other | Source: Ambulatory Visit | Attending: Orthopedic Surgery | Admitting: Orthopedic Surgery

## 2023-08-12 ENCOUNTER — Encounter (HOSPITAL_COMMUNITY)
Admission: RE | Disposition: A | Discharge status: Home / Self Care | Payer: Self-pay | Source: Ambulatory Visit | Attending: Orthopedic Surgery

## 2023-08-12 ENCOUNTER — Encounter (HOSPITAL_COMMUNITY): Payer: Self-pay | Admitting: Orthopedic Surgery

## 2023-08-12 DIAGNOSIS — M179 Osteoarthritis of knee, unspecified: Secondary | ICD-10-CM | POA: Diagnosis present

## 2023-08-12 DIAGNOSIS — E039 Hypothyroidism, unspecified: Secondary | ICD-10-CM | POA: Diagnosis not present

## 2023-08-12 DIAGNOSIS — M1711 Unilateral primary osteoarthritis, right knee: Secondary | ICD-10-CM | POA: Insufficient documentation

## 2023-08-12 DIAGNOSIS — G8918 Other acute postprocedural pain: Secondary | ICD-10-CM | POA: Diagnosis not present

## 2023-08-12 DIAGNOSIS — K219 Gastro-esophageal reflux disease without esophagitis: Secondary | ICD-10-CM | POA: Diagnosis not present

## 2023-08-12 DIAGNOSIS — D649 Anemia, unspecified: Secondary | ICD-10-CM | POA: Diagnosis not present

## 2023-08-12 HISTORY — PX: TOTAL KNEE ARTHROPLASTY: SHX125

## 2023-08-12 LAB — GLUCOSE, CAPILLARY: Glucose-Capillary: 216 mg/dL — ABNORMAL HIGH (ref 70–99)

## 2023-08-12 SURGERY — ARTHROPLASTY, KNEE, TOTAL
Anesthesia: Spinal | Site: Knee | Laterality: Right

## 2023-08-12 MED ORDER — DEXAMETHASONE SODIUM PHOSPHATE 10 MG/ML IJ SOLN
8.0000 mg | Freq: Once | INTRAMUSCULAR | Status: AC
  Administered 2023-08-12: 8 mg via INTRAVENOUS

## 2023-08-12 MED ORDER — BUPIVACAINE LIPOSOME 1.3 % IJ SUSP
INTRAMUSCULAR | Status: DC | PRN
  Administered 2023-08-12: 20 mL

## 2023-08-12 MED ORDER — FENTANYL CITRATE (PF) 100 MCG/2ML IJ SOLN
INTRAMUSCULAR | Status: DC | PRN
  Administered 2023-08-12 (×2): 50 ug via INTRAVENOUS

## 2023-08-12 MED ORDER — ACETAMINOPHEN 160 MG/5ML PO SOLN
325.0000 mg | Freq: Once | ORAL | Status: DC | PRN
Start: 2023-08-12 — End: 2023-08-12

## 2023-08-12 MED ORDER — ONDANSETRON HCL 4 MG/2ML IJ SOLN
4.0000 mg | Freq: Four times a day (QID) | INTRAMUSCULAR | Status: DC | PRN
  Administered 2023-08-12 – 2023-08-13 (×2): 4 mg via INTRAVENOUS
  Filled 2023-08-12 (×2): qty 2

## 2023-08-12 MED ORDER — HYDROMORPHONE HCL 1 MG/ML IJ SOLN: 0.2500 mg | INTRAMUSCULAR | Status: DC | PRN

## 2023-08-12 MED ORDER — PHENYLEPHRINE HCL-NACL 20-0.9 MG/250ML-% IV SOLN
INTRAVENOUS | Status: AC
  Filled 2023-08-12: qty 250

## 2023-08-12 MED ORDER — SODIUM CHLORIDE 0.9% FLUSH: 10.0000 mL | Freq: Two times a day (BID) | INTRAVENOUS | Status: DC

## 2023-08-12 MED ORDER — SODIUM CHLORIDE (PF) 0.9 % IJ SOLN
INTRAMUSCULAR | Status: AC
  Filled 2023-08-12: qty 10

## 2023-08-12 MED ORDER — BUPIVACAINE IN DEXTROSE 0.75-8.25 % IT SOLN
INTRATHECAL | Status: DC | PRN
  Administered 2023-08-12: 1.6 mL via INTRATHECAL

## 2023-08-12 MED ORDER — DROPERIDOL 2.5 MG/ML IJ SOLN
0.6250 mg | Freq: Once | INTRAMUSCULAR | Status: DC | PRN
Start: 2023-08-12 — End: 2023-08-12

## 2023-08-12 MED ORDER — ONDANSETRON HCL 4 MG/2ML IJ SOLN
INTRAMUSCULAR | Status: DC | PRN
  Administered 2023-08-12: 4 mg via INTRAVENOUS

## 2023-08-12 MED ORDER — ACETAMINOPHEN 325 MG PO TABS
325.0000 mg | ORAL_TABLET | Freq: Once | ORAL | Status: DC | PRN
Start: 2023-08-12 — End: 2023-08-12

## 2023-08-12 MED ORDER — DEXAMETHASONE SODIUM PHOSPHATE 10 MG/ML IJ SOLN
10.0000 mg | Freq: Once | INTRAMUSCULAR | Status: AC
  Administered 2023-08-13: 10 mg via INTRAVENOUS
  Filled 2023-08-12: qty 1

## 2023-08-12 MED ORDER — PANTOPRAZOLE SODIUM 40 MG PO TBEC
40.0000 mg | DELAYED_RELEASE_TABLET | Freq: Two times a day (BID) | ORAL | Status: DC
  Administered 2023-08-12 – 2023-08-13 (×2): 40 mg via ORAL
  Filled 2023-08-12 (×2): qty 1

## 2023-08-12 MED ORDER — DOCUSATE SODIUM 100 MG PO CAPS
100.0000 mg | ORAL_CAPSULE | Freq: Two times a day (BID) | ORAL | Status: DC
  Administered 2023-08-12 – 2023-08-13 (×2): 100 mg via ORAL
  Filled 2023-08-12 (×2): qty 1

## 2023-08-12 MED ORDER — METOCLOPRAMIDE HCL 5 MG/ML IJ SOLN: 5.0000 mg | Freq: Three times a day (TID) | INTRAMUSCULAR | Status: DC | PRN

## 2023-08-12 MED ORDER — DIPHENHYDRAMINE HCL 12.5 MG/5ML PO ELIX: 12.5000 mg | ORAL_SOLUTION | ORAL | Status: DC | PRN

## 2023-08-12 MED ORDER — ACETAMINOPHEN 325 MG PO TABS
325.0000 mg | ORAL_TABLET | Freq: Four times a day (QID) | ORAL | Status: DC | PRN
Start: 2023-08-13 — End: 2023-08-13

## 2023-08-12 MED ORDER — TRAMADOL HCL 50 MG PO TABS
50.0000 mg | ORAL_TABLET | Freq: Four times a day (QID) | ORAL | Status: DC | PRN
  Administered 2023-08-12 – 2023-08-13 (×3): 100 mg via ORAL
  Filled 2023-08-12 (×3): qty 2

## 2023-08-12 MED ORDER — BUPIVACAINE LIPOSOME 1.3 % IJ SUSP: 20.0000 mL | Freq: Once | INTRAMUSCULAR | Status: AC

## 2023-08-12 MED ORDER — ONDANSETRON HCL 4 MG PO TABS: 4.0000 mg | ORAL_TABLET | Freq: Four times a day (QID) | ORAL | Status: DC | PRN

## 2023-08-12 MED ORDER — ROPIVACAINE HCL 5 MG/ML IJ SOLN
INTRAMUSCULAR | Status: DC | PRN
  Administered 2023-08-12: 30 mL via PERINEURAL

## 2023-08-12 MED ORDER — METOCLOPRAMIDE HCL 5 MG PO TABS: 5.0000 mg | ORAL_TABLET | Freq: Three times a day (TID) | ORAL | Status: DC | PRN

## 2023-08-12 MED ORDER — LORATADINE 10 MG PO TABS
10.0000 mg | ORAL_TABLET | Freq: Every day | ORAL | Status: DC
  Administered 2023-08-13: 10 mg via ORAL
  Filled 2023-08-12: qty 1

## 2023-08-12 MED ORDER — CEFAZOLIN SODIUM-DEXTROSE 2-4 GM/100ML-% IV SOLN
2.0000 g | INTRAVENOUS | Status: AC
  Administered 2023-08-12: 2 g via INTRAVENOUS
  Filled 2023-08-12: qty 100

## 2023-08-12 MED ORDER — CYCLOBENZAPRINE HCL 10 MG PO TABS
10.0000 mg | ORAL_TABLET | Freq: Three times a day (TID) | ORAL | Status: DC | PRN
  Administered 2023-08-13: 10 mg via ORAL
  Filled 2023-08-12: qty 1

## 2023-08-12 MED ORDER — ONDANSETRON HCL 4 MG/2ML IJ SOLN
INTRAMUSCULAR | Status: AC
  Filled 2023-08-12: qty 2

## 2023-08-12 MED ORDER — PROPOFOL 1000 MG/100ML IV EMUL
INTRAVENOUS | Status: AC
  Filled 2023-08-12: qty 100

## 2023-08-12 MED ORDER — FENTANYL CITRATE (PF) 100 MCG/2ML IJ SOLN
INTRAMUSCULAR | Status: AC
  Filled 2023-08-12: qty 2

## 2023-08-12 MED ORDER — LEVOTHYROXINE SODIUM 50 MCG PO TABS
50.0000 ug | ORAL_TABLET | Freq: Every day | ORAL | Status: DC
  Administered 2023-08-13: 50 ug via ORAL
  Filled 2023-08-12: qty 1

## 2023-08-12 MED ORDER — 0.9 % SODIUM CHLORIDE (POUR BTL) OPTIME
TOPICAL | Status: DC | PRN
  Administered 2023-08-12: 1000 mL

## 2023-08-12 MED ORDER — SODIUM CHLORIDE (PF) 0.9 % IJ SOLN
INTRAMUSCULAR | Status: DC | PRN
  Administered 2023-08-12: 60 mL

## 2023-08-12 MED ORDER — LACTATED RINGERS IV SOLN: INTRAVENOUS | Status: DC

## 2023-08-12 MED ORDER — PHENYLEPHRINE HCL-NACL 20-0.9 MG/250ML-% IV SOLN
INTRAVENOUS | Status: DC | PRN
  Administered 2023-08-12: 30 ug/min via INTRAVENOUS

## 2023-08-12 MED ORDER — PROPOFOL 500 MG/50ML IV EMUL
INTRAVENOUS | Status: DC | PRN
  Administered 2023-08-12: 80 ug/kg/min via INTRAVENOUS

## 2023-08-12 MED ORDER — SODIUM CHLORIDE 0.9 % IR SOLN
Status: DC | PRN
  Administered 2023-08-12: 1000 mL

## 2023-08-12 MED ORDER — OXYCODONE HCL 5 MG PO TABS
5.0000 mg | ORAL_TABLET | ORAL | Status: DC | PRN
  Administered 2023-08-13 (×2): 5 mg via ORAL
  Filled 2023-08-12 (×3): qty 1

## 2023-08-12 MED ORDER — MAGNESIUM OXIDE -MG SUPPLEMENT 400 (240 MG) MG PO TABS
200.0000 mg | ORAL_TABLET | Freq: Every day | ORAL | Status: DC
  Administered 2023-08-13: 200 mg via ORAL
  Filled 2023-08-12: qty 1

## 2023-08-12 MED ORDER — ORAL CARE MOUTH RINSE: 15.0000 mL | Freq: Once | OROMUCOSAL | Status: AC

## 2023-08-12 MED ORDER — LIDOCAINE HCL (PF) 2 % IJ SOLN
INTRAMUSCULAR | Status: DC | PRN
  Administered 2023-08-12: 60 mg via INTRADERMAL

## 2023-08-12 MED ORDER — PHENOL 1.4 % MT LIQD: 1.0000 | OROMUCOSAL | Status: DC | PRN

## 2023-08-12 MED ORDER — SODIUM CHLORIDE 0.9 % IV SOLN: INTRAVENOUS | Status: DC

## 2023-08-12 MED ORDER — BISACODYL 10 MG RE SUPP: 10.0000 mg | Freq: Every day | RECTAL | Status: DC | PRN

## 2023-08-12 MED ORDER — POVIDONE-IODINE 10 % EX SWAB: 2.0000 | Freq: Once | CUTANEOUS | Status: AC

## 2023-08-12 MED ORDER — HYDROMORPHONE HCL 1 MG/ML IJ SOLN: 0.5000 mg | INTRAMUSCULAR | Status: DC | PRN

## 2023-08-12 MED ORDER — STERILE WATER FOR IRRIGATION IR SOLN
Status: DC | PRN
  Administered 2023-08-12: 1000 mL

## 2023-08-12 MED ORDER — BUPIVACAINE LIPOSOME 1.3 % IJ SUSP
INTRAMUSCULAR | Status: AC
  Filled 2023-08-12: qty 20

## 2023-08-12 MED ORDER — SODIUM CHLORIDE (PF) 0.9 % IJ SOLN
INTRAMUSCULAR | Status: AC
  Filled 2023-08-12: qty 50

## 2023-08-12 MED ORDER — FLEET ENEMA RE ENEM: 1.0000 | ENEMA | Freq: Once | RECTAL | Status: DC | PRN

## 2023-08-12 MED ORDER — ACETAMINOPHEN 10 MG/ML IV SOLN
1000.0000 mg | Freq: Four times a day (QID) | INTRAVENOUS | Status: DC
  Administered 2023-08-12: 1000 mg via INTRAVENOUS
  Filled 2023-08-12: qty 100

## 2023-08-12 MED ORDER — RIVAROXABAN 10 MG PO TABS
10.0000 mg | ORAL_TABLET | Freq: Every day | ORAL | Status: DC
  Administered 2023-08-13: 10 mg via ORAL
  Filled 2023-08-12: qty 1

## 2023-08-12 MED ORDER — CEFAZOLIN SODIUM-DEXTROSE 2-4 GM/100ML-% IV SOLN
2.0000 g | Freq: Four times a day (QID) | INTRAVENOUS | Status: AC
  Administered 2023-08-12 – 2023-08-13 (×2): 2 g via INTRAVENOUS
  Filled 2023-08-12 (×2): qty 100

## 2023-08-12 MED ORDER — CHLORHEXIDINE GLUCONATE 0.12 % MT SOLN
15.0000 mL | Freq: Once | OROMUCOSAL | Status: AC
  Administered 2023-08-12: 15 mL via OROMUCOSAL

## 2023-08-12 MED ORDER — POLYETHYLENE GLYCOL 3350 17 G PO PACK: 17.0000 g | PACK | Freq: Every day | ORAL | Status: DC | PRN

## 2023-08-12 MED ORDER — TRANEXAMIC ACID-NACL 1000-0.7 MG/100ML-% IV SOLN
1000.0000 mg | INTRAVENOUS | Status: AC
  Administered 2023-08-12: 1000 mg via INTRAVENOUS
  Filled 2023-08-12: qty 100

## 2023-08-12 MED ORDER — DEXAMETHASONE SODIUM PHOSPHATE 10 MG/ML IJ SOLN
INTRAMUSCULAR | Status: AC
  Filled 2023-08-12: qty 1

## 2023-08-12 MED ORDER — ACETAMINOPHEN 500 MG PO TABS
1000.0000 mg | ORAL_TABLET | Freq: Four times a day (QID) | ORAL | Status: DC
  Administered 2023-08-12 – 2023-08-13 (×3): 1000 mg via ORAL
  Filled 2023-08-12 (×3): qty 2

## 2023-08-12 MED ORDER — ACETAMINOPHEN 10 MG/ML IV SOLN: 1000.0000 mg | Freq: Once | INTRAVENOUS | Status: DC | PRN

## 2023-08-12 MED ORDER — LIDOCAINE HCL (PF) 2 % IJ SOLN
INTRAMUSCULAR | Status: AC
  Filled 2023-08-12: qty 5

## 2023-08-12 MED ORDER — MENTHOL 3 MG MT LOZG: 1.0000 | LOZENGE | OROMUCOSAL | Status: DC | PRN

## 2023-08-12 SURGICAL SUPPLY — 52 items
ADH SKN CLS APL DERMABOND .7 (GAUZE/BANDAGES/DRESSINGS) ×1
ATTUNE PSFEM RTSZ5 NARCEM KNEE (Femur) IMPLANT
ATTUNE PSRP INSE SZ5 7 KNEE (Insert) IMPLANT
BAG COUNTER SPONGE SURGICOUNT (BAG) IMPLANT
BAG SPEC THK2 15X12 ZIP CLS (MISCELLANEOUS) ×1
BAG SPNG CNTER NS LX DISP (BAG)
BAG ZIPLOCK 12X15 (MISCELLANEOUS) ×1 IMPLANT
BASEPLATE TIBIAL ROTATING SZ 4 (Knees) IMPLANT
BLADE SAG 18X100X1.27 (BLADE) ×1 IMPLANT
BLADE SAW SGTL 11.0X1.19X90.0M (BLADE) ×1 IMPLANT
BNDG CMPR 6 X 5 YARDS HK CLSR (GAUZE/BANDAGES/DRESSINGS) ×1
BNDG ELASTIC 6INX 5YD STR LF (GAUZE/BANDAGES/DRESSINGS) ×1 IMPLANT
BOWL SMART MIX CTS (DISPOSABLE) ×1 IMPLANT
BSPLAT TIB 4 CMNT ROT PLAT STR (Knees) ×1 IMPLANT
CEMENT HV SMART SET (Cement) ×2 IMPLANT
COVER SURGICAL LIGHT HANDLE (MISCELLANEOUS) ×1 IMPLANT
CUFF TOURN SGL QUICK 34 (TOURNIQUET CUFF) ×1
CUFF TRNQT CYL 34X4.125X (TOURNIQUET CUFF) ×1 IMPLANT
DERMABOND ADVANCED .7 DNX12 (GAUZE/BANDAGES/DRESSINGS) ×1 IMPLANT
DRAPE U-SHAPE 47X51 STRL (DRAPES) ×1 IMPLANT
DRSG AQUACEL AG ADV 3.5X10 (GAUZE/BANDAGES/DRESSINGS) ×1 IMPLANT
DURAPREP 26ML APPLICATOR (WOUND CARE) ×1 IMPLANT
ELECT REM PT RETURN 15FT ADLT (MISCELLANEOUS) ×1 IMPLANT
GLOVE BIO SURGEON STRL SZ 6.5 (GLOVE) IMPLANT
GLOVE BIO SURGEON STRL SZ8 (GLOVE) ×1 IMPLANT
GLOVE BIOGEL PI IND STRL 6.5 (GLOVE) IMPLANT
GLOVE BIOGEL PI IND STRL 7.0 (GLOVE) IMPLANT
GLOVE BIOGEL PI IND STRL 8 (GLOVE) ×1 IMPLANT
GOWN STRL REUS W/ TWL LRG LVL3 (GOWN DISPOSABLE) ×1 IMPLANT
GOWN STRL REUS W/TWL LRG LVL3 (GOWN DISPOSABLE) ×1
HANDPIECE INTERPULSE COAX TIP (DISPOSABLE) ×1
HOLDER FOLEY CATH W/STRAP (MISCELLANEOUS) IMPLANT
IMMOBILIZER KNEE 20 (SOFTGOODS) ×1
IMMOBILIZER KNEE 20 THIGH 36 (SOFTGOODS) ×1 IMPLANT
KIT TURNOVER KIT A (KITS) IMPLANT
MANIFOLD NEPTUNE II (INSTRUMENTS) ×1 IMPLANT
NS IRRIG 1000ML POUR BTL (IV SOLUTION) ×1 IMPLANT
PACK TOTAL KNEE CUSTOM (KITS) ×1 IMPLANT
PADDING CAST COTTON 6X4 STRL (CAST SUPPLIES) ×2 IMPLANT
PATELLA MEDIAL ATTUN 35MM KNEE (Knees) IMPLANT
PIN STEINMAN FIXATION KNEE (PIN) IMPLANT
PROTECTOR NERVE ULNAR (MISCELLANEOUS) ×1 IMPLANT
SET HNDPC FAN SPRY TIP SCT (DISPOSABLE) ×1 IMPLANT
SUT MNCRL AB 4-0 PS2 18 (SUTURE) ×1 IMPLANT
SUT STRATAFIX 0 PDS 27 VIOLET (SUTURE) ×1
SUT VIC AB 2-0 CT1 27 (SUTURE) ×3
SUT VIC AB 2-0 CT1 TAPERPNT 27 (SUTURE) ×3 IMPLANT
SUTURE STRATFX 0 PDS 27 VIOLET (SUTURE) ×1 IMPLANT
TRAY FOLEY MTR SLVR 16FR STAT (SET/KITS/TRAYS/PACK) IMPLANT
TUBE SUCTION HIGH CAP CLEAR NV (SUCTIONS) ×1 IMPLANT
WATER STERILE IRR 1000ML POUR (IV SOLUTION) ×2 IMPLANT
WRAP KNEE MAXI GEL POST OP (GAUZE/BANDAGES/DRESSINGS) ×1 IMPLANT

## 2023-08-12 NOTE — Care Plan (Signed)
Ortho Bundle Case Management Note  Patient Details  Name: Danielle Ritter MRN: [id number] Date of Birth: 16-Nov-1945                  R TKA on 08/12/23.  DCP: Home with husband.  DME: No needs. Has RW.  PT: Kevan Ny   DME Arranged:  N/A DME Agency:        Additional Comments: Please contact me with any questions of if this plan should need to change.    Despina Pole, CCM Case Manager, [facility]  [phone] 08/12/2023, 1:28 PM

## 2023-08-12 NOTE — Progress Notes (Signed)
Orthopedic Tech Progress Note Patient Details:  Danielle Ritter 01/24/1946 [id number]  CPM Right Knee CPM Right Knee: Off Right Knee Flexion (Degrees): 40 Right Knee Extension (Degrees): 10  Post Interventions Patient Tolerated: Well  [name] 08/12/2023, 1:26 PM

## 2023-08-12 NOTE — Progress Notes (Signed)
Orthopedic Tech Progress Note Patient Details:  Danielle Ritter 08-12-1946 [id number]  CPM Right Knee CPM Right Knee: On Right Knee Flexion (Degrees): 40 Right Knee Extension (Degrees): 10  Post Interventions Patient Tolerated: Well  [name] 08/12/2023, 9:18 AM

## 2023-08-12 NOTE — Anesthesia Procedure Notes (Signed)
Spinal  Start time: 08/12/2023 7:15 AM End time: 08/12/2023 7:17 AM Reason for block: surgical anesthesia Staffing Performed: anesthesiologist  Anesthesiologist: Shelton Silvas, MD Performed by: Shelton Silvas, MD Authorized by: Shelton Silvas, MD   Preanesthetic Checklist Completed: patient identified, IV checked, site marked, risks and benefits discussed, surgical consent, monitors and equipment checked, pre-op evaluation and timeout performed Spinal Block Patient position: sitting Prep: DuraPrep and site prepped and draped Location: L3-4 Injection technique: single-shot Needle Needle type: Pencan  Needle gauge: 24 G Needle length: 10 cm Needle insertion depth: 10 cm Additional Notes Patient tolerated well. No immediate complications.  Functioning IV was confirmed and monitors were applied. Sterile prep and drape, including hand hygiene and sterile gloves were used. The patient was positioned and the back was prepped. The skin was anesthetized with lidocaine. Free flow of clear CSF was obtained prior to injecting local anesthetic into the CSF. The spinal needle aspirated freely following injection. The needle was carefully withdrawn. The patient tolerated the procedure well.

## 2023-08-12 NOTE — Anesthesia Postprocedure Evaluation (Signed)
Anesthesia Post Note  Patient: ALEXUS BUSBIN  Procedure(s) Performed: TOTAL KNEE ARTHROPLASTY (Right: Knee)     Patient location during evaluation: PACU Anesthesia Type: Spinal Level of consciousness: oriented and awake and alert Pain management: pain level controlled Vital Signs Assessment: post-procedure vital signs reviewed and stable Respiratory status: spontaneous breathing, respiratory function stable and patient connected to nasal cannula oxygen Cardiovascular status: blood pressure returned to baseline and stable Postop Assessment: no headache, no backache and no apparent nausea or vomiting Anesthetic complications: no  No notable events documented.  Last Vitals:  Vitals:   08/12/23 1255 08/12/23 1807  BP: (!) 136/98 126/66  Pulse: 76 64  Resp: 18 18  Temp: (!) 36.4 C (!) 36.4 C  SpO2: 99% 98%    Last Pain:  Vitals:   08/12/23 1210  TempSrc:   PainSc: 3                  Shelton Silvas

## 2023-08-12 NOTE — Transfer of Care (Signed)
Immediate Anesthesia Transfer of Care Note  Patient: Danielle Ritter  Procedure(s) Performed: TOTAL KNEE ARTHROPLASTY (Right: Knee)  Patient Location: PACU  Anesthesia Type:MAC and Spinal  Level of Consciousness: awake and alert   Airway & Oxygen Therapy: Patient Spontanous Breathing  Post-op Assessment: Report given to RN and Post -op Vital signs reviewed and stable  Post vital signs: Reviewed and stable  Last Vitals:  Vitals Value Taken Time  BP 117/67   Temp 97.1   Pulse 77   Resp 16   SpO2 97     Last Pain:  Vitals:   08/12/23 0618  TempSrc: Oral  PainSc:       Patients Stated Pain Goal: 4 (08/12/23 4403)  Complications: No notable events documented.

## 2023-08-12 NOTE — Discharge Instructions (Addendum)
 Frank Aluisio, MD Total Joint Specialist [facility] [address] [place], [place] [address] [phone]  TOTAL KNEE REPLACEMENT POSTOPERATIVE DIRECTIONS    Knee Rehabilitation, Guidelines Following Surgery  Results after knee surgery are often greatly improved when you follow the exercise, range of motion and muscle strengthening exercises prescribed by your doctor. Safety measures are also important to protect the knee from further injury. If any of these exercises cause you to have increased pain or swelling in your knee joint, decrease the amount until you are comfortable again and slowly increase them. If you have problems or questions, call your caregiver or physical therapist for advice.   BLOOD CLOT PREVENTION Take a 10 mg Xarelto once a day for three weeks following surgery. Then take an 81 mg Aspirin once a day for three weeks. Then discontinue Aspirin. You may resume your vitamins/supplements once you have discontinued the Xarelto. Do not take any NSAIDs (Advil, Aleve, Ibuprofen, Meloxicam, etc.) until you have discontinued the Xarelto.   HOME CARE INSTRUCTIONS  Remove items at home which could result in a fall. This includes throw rugs or furniture in walking pathways.  ICE to the affected knee as much as tolerated. Icing helps control swelling. If the swelling is well controlled you will be more comfortable and rehab easier. Continue to use ice on the knee for pain and swelling from surgery. You may notice swelling that will progress down to the foot and ankle. This is normal after surgery. Elevate the leg when you are not up walking on it.    Continue to use the breathing machine which will help keep your temperature down. It is common for your temperature to cycle up and down following surgery, especially at night when you are not up moving around and exerting yourself. The breathing machine keeps your lungs expanded and your temperature  down. Do not place pillow under the operative knee, focus on keeping the knee straight while resting  DIET You may resume your previous home diet once you are discharged from the hospital.  DRESSING / WOUND CARE / SHOWERING Keep your bulky bandage on for 2 days. On the third post-operative day you may remove the Ace bandage and gauze. There is a waterproof adhesive bandage on your skin which will stay in place until your first follow-up appointment. Once you remove this you will not need to place another bandage You may begin showering 3 days following surgery, but do not submerge the incision under water.  ACTIVITY For the first 5 days, the key is rest and control of pain and swelling Do your home exercises twice a day starting on post-operative day 3. On the days you go to physical therapy, just do the home exercises once that day. You should rest, ice and elevate the leg for 50 minutes out of every hour. Get up and walk/stretch for 10 minutes per hour. After 5 days you can increase your activity slowly as tolerated. Walk with your walker as instructed. Use the walker until you are comfortable transitioning to a cane. Walk with the cane in the opposite hand of the operative leg. You may discontinue the cane once you are comfortable and walking steadily. Avoid periods of inactivity such as sitting longer than an hour when not asleep. This helps prevent blood clots.  You may discontinue the knee immobilizer once you are able to perform a straight leg raise while lying down. You may resume a sexual relationship in one month or   when given the OK by your doctor.  You may return to work once you are cleared by your doctor.  Do not drive a car for 6 weeks or until released by your surgeon.  Do not drive while taking narcotics.  TED HOSE STOCKINGS Wear the elastic stockings on both legs for three weeks following surgery during the day. You may remove them at night for sleeping.  WEIGHT  BEARING Weight bearing as tolerated with assist device (walker, cane, etc) as directed, use it as long as suggested by your surgeon or therapist, typically at least 4-6 weeks.  POSTOPERATIVE CONSTIPATION PROTOCOL Constipation - defined medically as fewer than three stools per week and severe constipation as less than one stool per week.  One of the most common issues patients have following surgery is constipation.  Even if you have a regular bowel pattern at home, your normal regimen is likely to be disrupted due to multiple reasons following surgery.  Combination of anesthesia, postoperative narcotics, change in appetite and fluid intake all can affect your bowels.  In order to avoid complications following surgery, here are some recommendations in order to help you during your recovery period.  Colace (docusate) - Pick up an over-the-counter form of Colace or another stool softener and take twice a day as long as you are requiring postoperative pain medications.  Take with a full glass of water daily.  If you experience loose stools or diarrhea, hold the colace until you stool forms back up. If your symptoms do not get better within 1 week or if they get worse, check with your doctor. Dulcolax (bisacodyl) - Pick up over-the-counter and take as directed by the product packaging as needed to assist with the movement of your bowels.  Take with a full glass of water.  Use this product as needed if not relieved by Colace only.  MiraLax (polyethylene glycol) - Pick up over-the-counter to have on hand. MiraLax is a solution that will increase the amount of water in your bowels to assist with bowel movements.  Take as directed and can mix with a glass of water, juice, soda, coffee, or tea. Take if you go more than two days without a movement. Do not use MiraLax more than once per day. Call your doctor if you are still constipated or irregular after using this medication for 7 days in a row.  If you continue  to have problems with postoperative constipation, please contact the office for further assistance and recommendations.  If you experience "the worst abdominal pain ever" or develop nausea or vomiting, please contact the office immediatly for further recommendations for treatment.  ITCHING If you experience itching with your medications, try taking only a single pain pill, or even half a pain pill at a time.  You can also use Benadryl over the counter for itching or also to help with sleep.   MEDICATIONS See your medication summary on the "After Visit Summary" that the nursing staff will review with you prior to discharge.  You may have some home medications which will be placed on hold until you complete the course of blood thinner medication.  It is important for you to complete the blood thinner medication as prescribed by your surgeon.  Continue your approved medications as instructed at time of discharge.  PRECAUTIONS If you experience chest pain or shortness of breath - call 911 immediately for transfer to the hospital emergency department.  If you develop a fever greater that 101 F, purulent   drainage from wound, increased redness or drainage from wound, foul odor from the wound/dressing, or calf pain - CONTACT YOUR SURGEON.                                                   FOLLOW-UP APPOINTMENTS Make sure you keep all of your appointments after your operation with your surgeon and caregivers. You should call the office at the above phone number and make an appointment for approximately two weeks after the date of your surgery or on the date instructed by your surgeon outlined in the "After Visit Summary".  RANGE OF MOTION AND STRENGTHENING EXERCISES  Rehabilitation of the knee is important following a knee injury or an operation. After just a few days of immobilization, the muscles of the thigh which control the knee become weakened and shrink (atrophy). Knee exercises are designed to build up  the tone and strength of the thigh muscles and to improve knee motion. Often times heat used for twenty to thirty minutes before working out will loosen up your tissues and help with improving the range of motion but do not use heat for the first two weeks following surgery. These exercises can be done on a training (exercise) mat, on the floor, on a table or on a bed. Use what ever works the best and is most comfortable for you Knee exercises include:  Leg Lifts - While your knee is still immobilized in a splint or cast, you can do straight leg raises. Lift the leg to 60 degrees, hold for 3 sec, and slowly lower the leg. Repeat 10-20 times 2-3 times daily. Perform this exercise against resistance later as your knee gets better.  Quad and Hamstring Sets - Tighten up the muscle on the front of the thigh (Quad) and hold for 5-10 sec. Repeat this 10-20 times hourly. Hamstring sets are done by pushing the foot backward against an object and holding for 5-10 sec. Repeat as with quad sets.  Leg Slides: Lying on your back, slowly slide your foot toward your buttocks, bending your knee up off the floor (only go as far as is comfortable). Then slowly slide your foot back down until your leg is flat on the floor again. Angel Wings: Lying on your back spread your legs to the side as far apart as you can without causing discomfort.  A rehabilitation program following serious knee injuries can speed recovery and prevent re-injury in the future due to weakened muscles. Contact your doctor or a physical therapist for more information on knee rehabilitation.   POST-OPERATIVE OPIOID TAPER INSTRUCTIONS: It is important to wean off of your opioid medication as soon as possible. If you do not need pain medication after your surgery it is ok to stop day one. Opioids include: Codeine, Hydrocodone(Norco, Vicodin), Oxycodone(Percocet, oxycontin) and hydromorphone amongst others.  Long term and even short term use of opiods can  cause: Increased pain response Dependence Constipation Depression Respiratory depression And more.  Withdrawal symptoms can include Flu like symptoms Nausea, vomiting And more Techniques to manage these symptoms Hydrate well Eat regular healthy meals Stay active Use relaxation techniques(deep breathing, meditating, yoga) Do Not substitute Alcohol to help with tapering If you have been on opioids for less than two weeks and do not have pain than it is ok to stop all together.  Plan to   wean off of opioids This plan should start within one week post op of your joint replacement. Maintain the same interval or time between taking each dose and first decrease the dose.  Cut the total daily intake of opioids by one tablet each day Next start to increase the time between doses. The last dose that should be eliminated is the evening dose.   IF YOU ARE TRANSFERRED TO A SKILLED REHAB FACILITY If the patient is transferred to a skilled rehab facility following release from the hospital, a list of the current medications will be sent to the facility for the patient to continue.  When discharged from the skilled rehab facility, please have the facility set up the patient's Home Health Physical Therapy prior to being released. Also, the skilled facility will be responsible for providing the patient with their medications at time of release from the facility to include their pain medication, the muscle relaxants, and their blood thinner medication. If the patient is still at the rehab facility at time of the two week follow up appointment, the skilled rehab facility will also need to assist the patient in arranging follow up appointment in our office and any transportation needs.  MAKE SURE YOU:  Understand these instructions.  Get help right away if you are not doing well or get worse.   DENTAL ANTIBIOTICS:  In most cases prophylactic antibiotics for Dental procdeures after total joint surgery are  not necessary.  Exceptions are as follows:  1. History of prior total joint infection  2. Severely immunocompromised (Organ Transplant, cancer chemotherapy, Rheumatoid biologic meds such as Humera)  3. Poorly controlled diabetes (A1C &gt; 8.0, blood glucose over 200)  If you have one of these conditions, contact your surgeon for an antibiotic prescription, prior to your dental procedure.    Pick up stool softner and laxative for home use following surgery while on pain medications. Do not submerge incision under water. Please use good hand washing techniques while changing dressing each day. May shower starting three days after surgery. Please use a clean towel to pat the incision dry following showers. Continue to use ice for pain and swelling after surgery. Do not use any lotions or creams on the incision until instructed by your surgeon.   Information on my medicine - XARELTO (Rivaroxaban)  This medication education was reviewed with me or my healthcare representative as part of my discharge preparation.  The pharmacist that spoke with me during my hospital stay was:    Why was Xarelto prescribed for you? Xarelto was prescribed for you to reduce the risk of blood clots forming after orthopedic surgery. The medical term for these abnormal blood clots is venous thromboembolism (VTE).  What do you need to know about xarelto ? Take your Xarelto ONCE DAILY at the same time every day. You may take it either with or without food.  If you have difficulty swallowing the tablet whole, you may crush it and mix in applesauce just prior to taking your dose.  Take Xarelto exactly as prescribed by your doctor and DO NOT stop taking Xarelto without talking to the doctor who prescribed the medication.  Stopping without other VTE prevention medication to take the place of Xarelto may increase your risk of developing a clot.  After discharge, you should have regular check-up  appointments with your healthcare provider that is prescribing your Xarelto.    What do you do if you miss a dose? If you miss a dose, take it as   soon as you remember on the same day then continue your regularly scheduled once daily regimen the next day. Do not take two doses of Xarelto on the same day.   Important Safety Information A possible side effect of Xarelto is bleeding. You should call your healthcare provider right away if you experience any of the following: Bleeding from an injury or your nose that does not stop. Unusual colored urine (red or dark brown) or unusual colored stools (red or black). Unusual bruising for unknown reasons. A serious fall or if you hit your head (even if there is no bleeding).  Some medicines may interact with Xarelto and might increase your risk of bleeding while on Xarelto. To help avoid this, consult your healthcare provider or pharmacist prior to using any new prescription or non-prescription medications, including herbals, vitamins, non-steroidal anti-inflammatory drugs (NSAIDs) and supplements.  This website has more information on Xarelto: www.xarelto.com.   

## 2023-08-12 NOTE — Interval H&P Note (Signed)
History and Physical Interval Note:  08/12/2023 6:31 AM  Danielle Ritter  has presented today for surgery, with the diagnosis of right knee osteoarthritis.  The various methods of treatment have been discussed with the patient and family. After consideration of risks, benefits and other options for treatment, the patient has consented to  Procedure(s): TOTAL KNEE ARTHROPLASTY (Right) as a surgical intervention.  The patient's history has been reviewed, patient examined, no change in status, stable for surgery.  I have reviewed the patient's chart and labs.  Questions were answered to the patient's satisfaction.     Homero Fellers Anai Lipson

## 2023-08-12 NOTE — Op Note (Signed)
OPERATIVE REPORT-TOTAL KNEE ARTHROPLASTY   Pre-operative diagnosis- Osteoarthritis  Right knee(s)  Post-operative diagnosis- Osteoarthritis Right knee(s)  Procedure-  Right  Total Knee Arthroplasty  Surgeon- Gus Rankin. Walter Grima, MD  Assistant- Arcola Jansky, PA-C   Anesthesia-   Adductor canal block and spinal  EBL- 25 ml   Drains None  Tourniquet time-  Total Tourniquet Time Documented: Thigh (Right) - 30 minutes Total: Thigh (Right) - 30 minutes     Complications- None  Condition-PACU - hemodynamically stable.   Brief Clinical Note  Danielle Ritter is a 77 y.o. 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 She has constant pain, with activity and at rest and significant functional deficits with difficulties even with ADLs. She has had extensive non-op management including analgesics, injections of cortisone and viscosupplements, and home exercise program, but remains in significant pain with significant dysfunction.Radiographs show bone on bone arthritis medial and patellofemoral. She presents now for right Total Knee Arthroplasty.     Procedure in detail---   The patient is brought into the operating room and positioned supine on the operating table. After successful administration of  Adductor canal block and spinal,   a tourniquet is placed high on the  Right thigh(s) and the lower extremity is prepped and draped in the usual sterile fashion. Time out is performed by the operating team and then the  Right lower extremity is wrapped in Esmarch, knee flexed and the tourniquet inflated to 300 mmHg.       A midline incision is made with a ten blade through the subcutaneous tissue to the level of the extensor mechanism. A fresh blade is used to make a medial parapatellar arthrotomy. Soft tissue over the proximal medial tibia is subperiosteally elevated to the joint line with a knife and into the semimembranosus bursa with a  Cobb elevator. Soft tissue over the proximal lateral tibia is elevated with attention being paid to avoiding the patellar tendon on the tibial tubercle. The patella is everted, knee flexed 90 degrees and the ACL and PCL are removed. Findings are bone on bone medial and patellofemoral with large global osteophytes        The drill is used to create a starting hole in the distal femur and the canal is thoroughly irrigated with sterile saline to remove the fatty contents. The 5 degree Right  valgus alignment guide is placed into the femoral canal and the distal femoral cutting block is pinned to remove 9 mm off the distal femur. Resection is made with an oscillating saw.      The tibia is subluxed forward and the menisci are removed. The extramedullary alignment guide is placed referencing proximally at the medial aspect of the tibial tubercle and distally along the second metatarsal axis and tibial crest. The block is pinned to remove 2mm off the more deficient medial  side. Resection is made with an oscillating saw. Size 4is the most appropriate size for the tibia and the proximal tibia is prepared with the modular drill and keel punch for that size.      The femoral sizing guide is placed and size 5 is most appropriate. Rotation is marked off the epicondylar axis and confirmed by creating a rectangular flexion gap at 90 degrees. The size 5 cutting block is pinned in this rotation and the anterior, posterior and chamfer cuts are made with the oscillating saw. The intercondylar block is then placed and that cut is  made.      Trial size 4 tibial component, trial size 5 narrow posterior stabilized femur and a 7  mm posterior stabilized rotating platform insert trial is placed. Full extension is achieved with excellent varus/valgus and anterior/posterior balance throughout full range of motion. The patella is everted and thickness measured to be 22  mm. Free hand resection is taken to 12 mm, a 35 template is placed,  lug holes are drilled, trial patella is placed, and it tracks normally. Osteophytes are removed off the posterior femur with the trial in place. All trials are removed and the cut bone surfaces prepared with pulsatile lavage. Cement is mixed and once ready for implantation, the size 4 tibial implant, size  5 narrow posterior stabilized femoral component, and the size 35 patella are cemented in place and the patella is held with the clamp. The trial insert is placed and the knee held in full extension. The Exparel (20 ml mixed with 60 ml saline) is injected into the extensor mechanism, posterior capsule, medial and lateral gutters and subcutaneous tissues.  All extruded cement is removed and once the cement is hard the permanent 7 mm posterior stabilized rotating platform insert is placed into the tibial tray.      The wound is copiously irrigated with saline solution and the extensor mechanism closed with # 0 Stratofix suture. The tourniquet is released for a total tourniquet time of 30  minutes. Flexion against gravity is 140 degrees and the patella tracks normally. Subcutaneous tissue is closed with 2.0 vicryl and subcuticular with running 4.0 Monocryl. The incision is cleaned and dried and steri-strips and a bulky sterile dressing are applied. The limb is placed into a knee immobilizer and the patient is awakened and transported to recovery in stable condition.      Please note that a surgical assistant was a medical necessity for this procedure in order to perform it in a safe and expeditious manner. Surgical assistant was necessary to retract the ligaments and vital neurovascular structures to prevent injury to them and also necessary for proper positioning of the limb to allow for anatomic placement of the prosthesis.   Gus Rankin Lizann Edelman, MD    08/12/2023, 8:13 AM

## 2023-08-12 NOTE — Anesthesia Preprocedure Evaluation (Addendum)
Anesthesia Evaluation  Patient identified by MRN, date of birth, ID band Patient awake    Reviewed: Allergy & Precautions, NPO status , Patient's Chart, lab work & pertinent test results  Airway Mallampati: II  TM Distance: >3 FB Neck ROM: Full    Dental  (+) Teeth Intact, Dental Advisory Given   Pulmonary neg pulmonary ROS   breath sounds clear to auscultation       Cardiovascular negative cardio ROS  Rhythm:Regular Rate:Normal     Neuro/Psych negative neurological ROS  negative psych ROS   GI/Hepatic Neg liver ROS,GERD  ,,  Endo/Other  Hypothyroidism    Renal/GU negative Renal ROS     Musculoskeletal  (+) Arthritis ,    Abdominal   Peds  Hematology  (+) Blood dyscrasia, anemia   Anesthesia Other Findings   Reproductive/Obstetrics                             Anesthesia Physical Anesthesia Plan  ASA: 2  Anesthesia Plan: Spinal   Post-op Pain Management: Regional block*   Induction: Intravenous  PONV Risk Score and Plan: 3 and Ondansetron, Dexamethasone and Midazolam  Airway Management Planned: Nasal Cannula and Natural Airway  Additional Equipment: None  Intra-op Plan:   Post-operative Plan:   Informed Consent: I have reviewed the patients History and Physical, chart, labs and discussed the procedure including the risks, benefits and alternatives for the proposed anesthesia with the patient or authorized representative who has indicated his/her understanding and acceptance.       Plan Discussed with: CRNA  Anesthesia Plan Comments: (Lab Results      Component                Value               Date                      WBC                      5.6                 07/31/2023                HGB                      14.2                07/31/2023                HCT                      44.0                07/31/2023                MCV                      93.6                 07/31/2023                PLT                      217                 07/31/2023           )  Anesthesia Quick Evaluation

## 2023-08-12 NOTE — Plan of Care (Signed)
Problem: Education: Goal: Knowledge of the prescribed therapeutic regimen will improve Outcome: Progressing   Problem: Clinical Measurements: Goal: Postoperative complications will be avoided or minimized Outcome: Progressing   Problem: Pain Management: Goal: Pain level will decrease with appropriate interventions Outcome: Progressing   Problem: Safety: Goal: Ability to remain free from injury will improve Outcome: Progressing   Haydee Salter, RN 08/12/23 6:46 PM

## 2023-08-12 NOTE — Evaluation (Signed)
Physical Therapy Evaluation Patient Details Name: Danielle Ritter MRN: [id number] DOB: 03-15-1946 Today's Date: 08/12/2023  History of Present Illness  77 yo female presents to therapy s/p R TKA on 08/12/2023 due to failure of conservative measures. 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  Clinical Impression      Danielle Ritter is a 77 y.o. female POD 0 s/p R TKA. Patient reports IND with mobility at baseline. Patient is now limited by functional impairments (see PT problem list below) and requires CGA for bed mobility and min A  for transfers. Pt reported feeling dizzy and nauseated with pre-gait tasks and required min A to pivot to recliner with RW. Pt Bp 92/53 (62 PR) and nurse made aware.  Patient will benefit from continued skilled PT interventions to address impairments and progress towards PLOF. Acute PT will follow to progress mobility and stair training in preparation for safe discharge home with family support and OPPT services scheduled for 10/31.     If plan is discharge home, recommend the following: A little help with walking and/or transfers;A little help with bathing/dressing/bathroom;Assistance with cooking/housework;Assist for transportation;Help with stairs or ramp for entrance   Can travel by private vehicle        Equipment Recommendations None recommended by PT  Recommendations for Other Services       Functional Status Assessment Patient has had a recent decline in their functional status and demonstrates the ability to make significant improvements in function in a reasonable and predictable amount of time.     Precautions / Restrictions Precautions Precautions: Knee;Fall Restrictions Weight Bearing Restrictions: No      Mobility  Bed Mobility Overal bed mobility: Needs Assistance Bed Mobility: Supine to Sit     Supine to sit: Contact guard, HOB elevated, Used rails      General bed mobility comments: min cues    Transfers Overall transfer level: Needs assistance Equipment used: Rolling walker (2 wheels) Transfers: Sit to/from Stand Sit to Stand: Min assist, From elevated surface           General transfer comment: min cues    Ambulation/Gait               General Gait Details: NT due to reports of dizziness and nausea  Stairs            Wheelchair Mobility     Tilt Bed    Modified Rankin (Stroke Patients Only)       Balance Overall balance assessment: Needs assistance Sitting-balance support: Feet supported Sitting balance-Leahy Scale: Good     Standing balance support: Bilateral upper extremity supported, During functional activity, Reliant on assistive device for balance Standing balance-Leahy Scale: Poor                               Pertinent Vitals/Pain Pain Assessment Pain Assessment: 0-10 Pain Score: 3  Pain Location: R knee and LE Pain Descriptors / Indicators: Aching, Constant, Discomfort, Operative site guarding Pain Intervention(s): Limited activity within patient's tolerance, Monitored during session, Premedicated before session, Repositioned, Ice applied    Home Living Family/patient expects to be discharged to:: Private residence Living Arrangements: Spouse/significant other Available Help at Discharge: Family Type of Home: House Home Access: Stairs to enter Entrance Stairs-Rails: None Entrance Stairs-Number of Steps: 2 + 1 or back 2 steps with grab bar - back  Home Layout: One level Home Equipment: Agricultural consultant (2 wheels);Cane - single point      Prior Function Prior Level of Function : Independent/Modified Independent;Driving             Mobility Comments: IND no AD for all ADLs, self care tasks and IADLs       Extremity/Trunk Assessment        Lower Extremity Assessment Lower Extremity Assessment: RLE deficits/detail RLE Deficits / Details: ankle DF/PF 5/5;  SLR < 10 degree lag RLE Sensation: WNL    Cervical / Trunk Assessment Cervical / Trunk Assessment: Normal  Communication   Communication Communication: No apparent difficulties  Cognition Arousal: Alert Behavior During Therapy: WFL for tasks assessed/performed Overall Cognitive Status: Within Functional Limits for tasks assessed                                          General Comments      Exercises     Assessment/Plan    PT Assessment Patient needs continued PT services  PT Problem List Decreased strength;Decreased range of motion;Decreased activity tolerance;Decreased balance;Decreased mobility;Decreased coordination;Pain       PT Treatment Interventions DME instruction;Gait training;Stair training;Functional mobility training;Therapeutic activities;Therapeutic exercise;Balance training;Neuromuscular re-education;Patient/family education;Modalities    PT Goals (Current goals can be found in the Care Plan section)  Acute Rehab PT Goals Patient Stated Goal: to have the R knee be as good as the L PT Goal Formulation: With patient Time For Goal Achievement: 08/26/23 Potential to Achieve Goals: Good    Frequency 7X/week     Co-evaluation               AM-PAC PT "6 Clicks" Mobility  Outcome Measure Help needed turning from your back to your side while in a flat bed without using bedrails?: A Little Help needed moving from lying on your back to sitting on the side of a flat bed without using bedrails?: A Little Help needed moving to and from a bed to a chair (including a wheelchair)?: A Little Help needed standing up from a chair using your arms (e.g., wheelchair or bedside chair)?: A Little Help needed to walk in hospital room?: Total Help needed climbing 3-5 steps with a railing? : Total 6 Click Score: 14    End of Session Equipment Utilized During Treatment: Gait belt Activity Tolerance: Treatment limited secondary to medical complications  (Comment) (nausea and dizziness) Patient left: in chair;with call bell/phone within reach;with nursing/sitter in room Nurse Communication: Mobility status;Other (comment) (Bp findings) PT Visit Diagnosis: Unsteadiness on feet (R26.81);Other abnormalities of gait and mobility (R26.89);Muscle weakness (generalized) (M62.81);Difficulty in walking, not elsewhere classified (R26.2);Pain Pain - Right/Left: Right Pain - part of body: Knee;Leg    Time: 3295-1884 PT Time Calculation (min) (ACUTE ONLY): 25 min   Charges:   PT Evaluation $PT Eval Low Complexity: 1 Low PT Treatments $Therapeutic Activity: 8-22 mins PT General Charges $$ ACUTE PT VISIT: 1 Visit         Johnny Bridge, PT Acute Rehab   Jacqualyn Posey 08/12/2023, 7:08 PM

## 2023-08-12 NOTE — Anesthesia Procedure Notes (Signed)
Anesthesia Regional Block: Adductor canal block   Pre-Anesthetic Checklist: , timeout performed,  Correct Patient, Correct Site, Correct Laterality,  Correct Procedure, Correct Position, site marked,  Risks and benefits discussed,  Surgical consent,  Pre-op evaluation,  At surgeon's request and post-op pain management  Laterality: Right  Prep: chloraprep       Needles:  Injection technique: Single-shot  Needle Type: Echogenic Stimulator Needle     Needle Length: 9cm  Needle Gauge: 21     Additional Needles:   Procedures:,,,, ultrasound used (permanent image in chart),,    Narrative:  Start time: 08/12/2023 7:00 AM End time: 08/12/2023 7:05 AM Injection made incrementally with aspirations every 5 mL.  Performed by: Personally  Anesthesiologist: Shelton Silvas, MD  Additional Notes: Discussed risks and benefits of the nerve block in detail, including but not limited vascular injury, permanent nerve damage and infection.   Patient tolerated the procedure well. Local anesthetic introduced in an incremental fashion under minimal resistance after negative aspirations. No paresthesias were elicited. After completion of the procedure, no acute issues were identified and patient continued to be monitored by RN.

## 2023-08-13 ENCOUNTER — Encounter (HOSPITAL_COMMUNITY): Payer: Self-pay | Admitting: Orthopedic Surgery

## 2023-08-13 DIAGNOSIS — K219 Gastro-esophageal reflux disease without esophagitis: Secondary | ICD-10-CM | POA: Diagnosis not present

## 2023-08-13 DIAGNOSIS — M1711 Unilateral primary osteoarthritis, right knee: Secondary | ICD-10-CM | POA: Diagnosis not present

## 2023-08-13 DIAGNOSIS — E039 Hypothyroidism, unspecified: Secondary | ICD-10-CM | POA: Diagnosis not present

## 2023-08-13 DIAGNOSIS — D649 Anemia, unspecified: Secondary | ICD-10-CM | POA: Diagnosis not present

## 2023-08-13 LAB — BASIC METABOLIC PANEL
Anion gap: 7 (ref 5–15)
BUN: 15 mg/dL (ref 8–23)
CO2: 24 mmol/L (ref 22–32)
Calcium: 8.9 mg/dL (ref 8.9–10.3)
Chloride: 103 mmol/L (ref 98–111)
Creatinine, Ser: 0.71 mg/dL (ref 0.44–1.00)
GFR, Estimated: >60 mL/min (ref >60)
Glucose, Bld: 132 mg/dL — ABNORMAL HIGH (ref 70–99)
Potassium: 4.1 mmol/L (ref 3.5–5.1)
Sodium: 134 mmol/L — ABNORMAL LOW (ref 135–145)

## 2023-08-13 LAB — CBC
HCT: 35.4 % — ABNORMAL LOW (ref 36.0–46.0)
Hemoglobin: 11.3 g/dL — ABNORMAL LOW (ref 12.0–15.0)
MCH: 30.1 pg (ref 26.0–34.0)
MCHC: 31.9 g/dL (ref 30.0–36.0)
MCV: 94.4 fL (ref 80.0–100.0)
Platelets: 167 10*3/uL (ref 150–400)
RBC: 3.75 MIL/uL — ABNORMAL LOW (ref 3.87–5.11)
RDW: 13.3 % (ref 11.5–15.5)
WBC: 10 10*3/uL (ref 4.0–10.5)
nRBC: 0 % (ref 0.0–0.2)

## 2023-08-13 MED ORDER — OXYCODONE HCL 5 MG PO TABS: 5.0000 mg | ORAL_TABLET | Freq: Three times a day (TID) | ORAL | 0 refills | Status: AC | PRN

## 2023-08-13 MED ORDER — CYCLOBENZAPRINE HCL 10 MG PO TABS: 10.0000 mg | ORAL_TABLET | Freq: Three times a day (TID) | ORAL | 0 refills | Status: AC | PRN

## 2023-08-13 MED ORDER — RIVAROXABAN 10 MG PO TABS: 10.0000 mg | ORAL_TABLET | Freq: Every day | ORAL | 0 refills | Status: AC

## 2023-08-13 MED ORDER — TRAMADOL HCL 50 MG PO TABS: 50.0000 mg | ORAL_TABLET | Freq: Four times a day (QID) | ORAL | 0 refills | Status: AC | PRN

## 2023-08-13 MED ORDER — ONDANSETRON HCL 4 MG PO TABS: 4.0000 mg | ORAL_TABLET | Freq: Four times a day (QID) | ORAL | 0 refills | Status: AC | PRN

## 2023-08-13 MED ORDER — SODIUM CHLORIDE 0.9 % IV BOLUS
250.0000 mL | Freq: Once | INTRAVENOUS | Status: AC
  Administered 2023-08-13: 250 mL via INTRAVENOUS

## 2023-08-13 NOTE — Progress Notes (Signed)
Physical Therapy Treatment Patient Details Name: Danielle Ritter MRN: [id number] DOB: 05-01-1946 Today's Date: 08/13/2023   History of Present Illness 77 yo female presents to therapy s/p R TKA on 08/12/2023 due to failure of conservative measures. 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    PT Comments  Pt ambulated to the bathroom where she urinated, she reported some dizziness with walking to bathroom. With walking out of the bathroom pt became nauseous and diaphoretic. She was assisted to a recliner. Will plan a second PT session this afternoon.     If plan is discharge home, recommend the following: A little help with walking and/or transfers;A little help with bathing/dressing/bathroom;Assistance with cooking/housework;Assist for transportation;Help with stairs or ramp for entrance   Can travel by private vehicle        Equipment Recommendations  None recommended by PT    Recommendations for Other Services       Precautions / Restrictions Precautions Precautions: Knee;Fall Precaution Booklet Issued: Yes (comment) Precaution Comments: reviewed no pillow under knee Restrictions Weight Bearing Restrictions: No RLE Weight Bearing: Weight bearing as tolerated     Mobility  Bed Mobility Overal bed mobility: Needs Assistance Bed Mobility: Supine to Sit     Supine to sit: Contact guard, HOB elevated, Used rails     General bed mobility comments: min cues, increased time    Transfers Overall transfer level: Needs assistance Equipment used: Rolling walker (2 wheels) Transfers: Sit to/from Stand Sit to Stand: Min assist, From elevated surface           General transfer comment: VCs for hand placement, min A to power up    Ambulation/Gait Ambulation/Gait assistance: Contact guard assist Gait Distance (Feet): 15 Feet Assistive device: Rolling walker (2 wheels) Gait Pattern/deviations:  Step-to pattern Gait velocity: decr     General Gait Details: nausea and dizziness limited distance, 66' + 6' with RW, VCs sequencing   Stairs             Wheelchair Mobility     Tilt Bed    Modified Rankin (Stroke Patients Only)       Balance Overall balance assessment: Needs assistance Sitting-balance support: Feet supported Sitting balance-Leahy Scale: Good     Standing balance support: Bilateral upper extremity supported, During functional activity, Reliant on assistive device for balance Standing balance-Leahy Scale: Poor                              Cognition Arousal: Alert Behavior During Therapy: WFL for tasks assessed/performed Overall Cognitive Status: Within Functional Limits for tasks assessed                                          Exercises Total Joint Exercises Ankle Circles/Pumps: AROM, Both, 10 reps, Supine Heel Slides: AAROM, Right, 5 reps, Supine    General Comments        Pertinent Vitals/Pain Pain Assessment Pain Score: 4  Pain Location: R knee Pain Descriptors / Indicators: Aching, Constant, Discomfort, Operative site guarding Pain Intervention(s): Limited activity within patient's tolerance, Monitored during session, Premedicated before session, Ice applied    Home Living  Prior Function            PT Goals (current goals can now be found in the care plan section) Acute Rehab PT Goals Patient Stated Goal: to have the R knee be as good as the L PT Goal Formulation: With patient Time For Goal Achievement: 08/26/23 Potential to Achieve Goals: Good Progress towards PT goals: Progressing toward goals    Frequency    7X/week      PT Plan      Co-evaluation              AM-PAC PT "6 Clicks" Mobility   Outcome Measure  Help needed turning from your back to your side while in a flat bed without using bedrails?: None Help needed moving from lying  on your back to sitting on the side of a flat bed without using bedrails?: A Little Help needed moving to and from a bed to a chair (including a wheelchair)?: A Little Help needed standing up from a chair using your arms (e.g., wheelchair or bedside chair)?: A Little Help needed to walk in hospital room?: A Little Help needed climbing 3-5 steps with a railing? : A Lot 6 Click Score: 18    End of Session Equipment Utilized During Treatment: Gait belt Activity Tolerance: Treatment limited secondary to medical complications (Comment) (nausea and dizziness) Patient left: in chair;with call bell/phone within reach;with chair alarm set Nurse Communication: Mobility status;Other (comment) (nausea and dizziness with walking) PT Visit Diagnosis: Unsteadiness on feet (R26.81);Other abnormalities of gait and mobility (R26.89);Muscle weakness (generalized) (M62.81);Difficulty in walking, not elsewhere classified (R26.2);Pain Pain - Right/Left: Right Pain - part of body: Knee     Time: 4401-0272 PT Time Calculation (min) (ACUTE ONLY): 36 min  Charges:    $Gait Training: 8-22 mins $Therapeutic Activity: 8-22 mins PT General Charges $$ ACUTE PT VISIT: 1 Visit                     Tamala Ser PT 08/13/2023  Acute Rehabilitation Services  Office [phone]

## 2023-08-13 NOTE — TOC Transition Note (Signed)
Transition of Care [facility]) - CM/SW Discharge Note  Patient Details  Name: Danielle Ritter MRN: [id number] Date of Birth: 1946-06-07  [facility]) CM/SW Contact:  Ewing Schlein, LCSW Phone Number: 08/13/2023, 10:41 AM  Clinical Narrative: Patient is expected to discharge home after working with PT. CSW met with patient to confirm discharge plan. Patient will go home with OPPT at [facility]. Patient has a rolling walker at home, so there are no DME needs at this time. TOC signing off.    Final next level of care: OP Rehab Barriers to Discharge: No Barriers Identified  Patient Goals and CMS Choice Choice offered to / list presented to : NA  Discharge Plan and Services Additional resources added to the After Visit Summary for       DME Arranged: N/A DME Agency: NA  Social Determinants of Health (SDOH) Interventions SDOH Screenings   Food Insecurity: No Food Insecurity (08/12/2023)  Housing: Low Risk  (08/12/2023)  Transportation Needs: No Transportation Needs (08/12/2023)  Utilities: Not At Risk (08/12/2023)  Tobacco Use: Low Risk  (08/12/2023)   Readmission Risk Interventions     No data to display

## 2023-08-13 NOTE — Plan of Care (Signed)
Patient discharging home with husband via private vehicle. Haydee Salter, RN 08/13/23 3:12 PM

## 2023-08-13 NOTE — Progress Notes (Signed)
   Subjective: 1 Day Post-Op Procedure(s) (LRB): TOTAL KNEE ARTHROPLASTY (Right) Patient seen in rounds by Dr. Lequita Halt. Patient is  well. Had issues with dizziness and nausea yesterday though feeling somewhat better this morning . Denies SOB or chest pain. Denies calf pain. Foley cath removed this AM. Patient reports pain as moderate. Worked with physical therapy yesterday but limited by dizziness and nausea. We will continue physical therapy today.  Objective: Vital signs in last 24 hours: Temp:  [97.5 F (36.4 C)-98.4 F (36.9 C)] 97.9 F (36.6 C) (10/29 0616) Pulse Rate:  [64-78] 70 (10/29 0616) Resp:  [13-18] 16 (10/29 0616) BP: (116-152)/(58-98) 116/60 (10/29 0616) SpO2:  [94 %-100 %] 97 % (10/29 0616)  Intake/Output from previous day:  Intake/Output Summary (Last 24 hours) at 08/13/2023 0804 Last data filed at 08/13/2023 0700 Gross per 24 hour  Intake 2401.93 ml  Output 2320 ml  Net 81.93 ml     Intake/Output this shift: No intake/output data recorded.  Labs: Recent Labs    08/13/23 0355  HGB 11.3*   Recent Labs    08/13/23 0355  WBC 10.0  RBC 3.75*  HCT 35.4*  PLT 167   Recent Labs    08/13/23 0355  NA 134*  K 4.1  CL 103  CO2 24  BUN 15  CREATININE 0.71  GLUCOSE 132*  CALCIUM 8.9   No results for input(s): "LABPT", "INR" in the last 72 hours.  Exam: General - Patient is Alert and Oriented Extremity - Neurologically intact Neurovascular intact Sensation intact distally Dorsiflexion/Plantar flexion intact Dressing - dressing C/D/I Motor Function - intact, moving foot and toes well on exam.  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   Weight as of this encounter: 65.8 kg. Advance diet Up with therapy D/C IV fluids  Patient's anticipated LOS is less than 2 midnights, meeting these requirements: - Lives within 1 hour of care - Has a competent adult at home to recover with post-op - NO history of  - Chronic pain requiring opiods  - Diabetes  - Coronary Artery Disease  - Heart failure  - Heart attack  - Stroke  - Cardiac arrhythmia  - Respiratory Failure/COPD  - Renal failure  - Anemia  - Advanced Liver disease  DVT Prophylaxis - Xarelto Weight bearing as tolerated.  Continue physical therapy. Hopeful to discharge home today pending progress, if meeting patient goals, and symptoms managed. Scheduled for OPPT at [facility]. Follow-up in clinic in 2 weeks.  The PDMP database was reviewed today prior to any opioid medications being prescribed to this patient.  R. Arcola Jansky, PA-C Orthopedic Surgery 08/13/2023, 8:04 AM

## 2023-08-13 NOTE — Progress Notes (Signed)
Physical Therapy Treatment Patient Details Name: Danielle Ritter MRN: [id number] DOB: 01-23-1946 Today's Date: 08/13/2023   History of Present Illness 77 yo female presents to therapy s/p R TKA on 08/12/2023 due to failure of conservative measures. 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    PT Comments  Pt tolerated increased ambulation distance of 52' with RW. Stair training completed. Just after doing stairs pt had nausea and vomiting. RN notified. Will attempt completion of HEP instruction later this afternoon.     If plan is discharge home, recommend the following: A little help with walking and/or transfers;A little help with bathing/dressing/bathroom;Assistance with cooking/housework;Assist for transportation;Help with stairs or ramp for entrance   Can travel by private vehicle        Equipment Recommendations  None recommended by PT    Recommendations for Other Services       Precautions / Restrictions Precautions Precautions: Knee;Fall Precaution Booklet Issued: Yes (comment) Precaution Comments: reviewed no pillow under knee Restrictions Weight Bearing Restrictions: No RLE Weight Bearing: Weight bearing as tolerated     Mobility  Bed Mobility Overal bed mobility: Needs Assistance Bed Mobility: Sit to Supine     Supine to sit: Contact guard, HOB elevated, Used rails Sit to supine: Min assist   General bed mobility comments: min A for RLE into bed    Transfers Overall transfer level: Needs assistance Equipment used: Rolling walker (2 wheels) Transfers: Sit to/from Stand Sit to Stand: From elevated surface, Contact guard assist           General transfer comment: VCs for hand placement    Ambulation/Gait Ambulation/Gait assistance: Contact guard assist Gait Distance (Feet): 90 Feet Assistive device: Rolling walker (2 wheels) Gait Pattern/deviations: Step-to pattern Gait  velocity: decr     General Gait Details: steady, no loss of balance   Stairs Stairs: Yes Stairs assistance: Contact guard assist Stair Management: Forwards, Two rails, Step to pattern Number of Stairs: 3 General stair comments: VCs sequencing, pt has L grab bar at home, she refused use of cane in R hand, stated she can hold storm door on R   Wheelchair Mobility     Tilt Bed    Modified Rankin (Stroke Patients Only)       Balance Overall balance assessment: Needs assistance Sitting-balance support: Feet supported Sitting balance-Leahy Scale: Good     Standing balance support: Bilateral upper extremity supported, During functional activity, Reliant on assistive device for balance Standing balance-Leahy Scale: Poor                              Cognition Arousal: Alert Behavior During Therapy: WFL for tasks assessed/performed Overall Cognitive Status: Within Functional Limits for tasks assessed                                          Exercises Total Joint Exercises Ankle Circles/Pumps: AROM, Both, 10 reps, Supine Heel Slides: AAROM, Right, 5 reps, Supine Long Arc Quad: AROM, Right, 5 reps, Seated Knee Flexion: AAROM, Right, 10 reps, Supine Goniometric ROM: 5-65* AAROM R knee    General Comments        Pertinent Vitals/Pain Pain Assessment Pain Score: 4  Pain Location: R knee Pain Descriptors / Indicators: Discomfort, Operative site guarding, Sore  Pain Intervention(s): Limited activity within patient's tolerance, Monitored during session, Premedicated before session, Ice applied    Home Living                          Prior Function            PT Goals (current goals can now be found in the care plan section) Acute Rehab PT Goals Patient Stated Goal: to have the R knee be as good as the L PT Goal Formulation: With patient Time For Goal Achievement: 08/26/23 Potential to Achieve Goals: Good Progress towards PT  goals: Progressing toward goals    Frequency    7X/week      PT Plan      Co-evaluation              AM-PAC PT "6 Clicks" Mobility   Outcome Measure  Help needed turning from your back to your side while in a flat bed without using bedrails?: None Help needed moving from lying on your back to sitting on the side of a flat bed without using bedrails?: A Little Help needed moving to and from a bed to a chair (including a wheelchair)?: A Little Help needed standing up from a chair using your arms (e.g., wheelchair or bedside chair)?: A Little Help needed to walk in hospital room?: A Little Help needed climbing 3-5 steps with a railing? : A Little 6 Click Score: 19    End of Session Equipment Utilized During Treatment: Gait belt Activity Tolerance: Treatment limited secondary to medical complications (Comment) (pt vomited at end of session) Patient left: with call bell/phone within reach;in bed;with bed alarm set;with nursing/sitter in room Nurse Communication: Mobility status;Other (comment) (pt vomited) PT Visit Diagnosis: Unsteadiness on feet (R26.81);Other abnormalities of gait and mobility (R26.89);Muscle weakness (generalized) (M62.81);Difficulty in walking, not elsewhere classified (R26.2);Pain Pain - Right/Left: Right Pain - part of body: Knee     Time: 9629-5284 PT Time Calculation (min) (ACUTE ONLY): 31 min  Charges:    $Gait Training: 8-22 mins $Therapeutic Exercise: 8-22 mins  PT General Charges $$ ACUTE PT VISIT: 1 Visit                     Tamala Ser PT 08/13/2023  Acute Rehabilitation Services  Office [phone]

## 2023-08-13 NOTE — Progress Notes (Signed)
Physical Therapy Treatment Patient Details Name: Danielle Ritter MRN: [id number] DOB: 17-Jul-1946 Today's Date: 08/13/2023   History of Present Illness 77 yo female presents to therapy s/p R TKA on 08/12/2023 due to failure of conservative measures. 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    PT Comments  Pt ambulated 15' from bathroom to bed, no loss of balance. Completed instruction in TKA HEP with spouse present. Pt did not have nausea/vomiting. She is ready to DC from a PT standpoint.      If plan is discharge home, recommend the following: A little help with walking and/or transfers;A little help with bathing/dressing/bathroom;Assistance with cooking/housework;Assist for transportation;Help with stairs or ramp for entrance   Can travel by private vehicle        Equipment Recommendations  None recommended by PT    Recommendations for Other Services       Precautions / Restrictions Precautions Precautions: Knee;Fall Precaution Booklet Issued: Yes (comment) Precaution Comments: reviewed no pillow under knee Restrictions Weight Bearing Restrictions: No RLE Weight Bearing: Weight bearing as tolerated     Mobility  Bed Mobility Overal bed mobility: Needs Assistance Bed Mobility: Sit to Supine     Supine to sit: Contact guard, HOB elevated, Used rails Sit to supine: Min assist   General bed mobility comments: min A for RLE into bed    Transfers Overall transfer level: Needs assistance Equipment used: Rolling walker (2 wheels) Transfers: Sit to/from Stand Sit to Stand: From elevated surface, Supervision           General transfer comment: VCs for hand placement    Ambulation/Gait Ambulation/Gait assistance: Supervision Gait Distance (Feet): 15 Feet Assistive device: Rolling walker (2 wheels) Gait Pattern/deviations: Step-to pattern Gait velocity: decr     General Gait Details:  steady, no loss of balance   Stairs Stairs: Yes Stairs assistance: Contact guard assist Stair Management: Forwards, Two rails, Step to pattern Number of Stairs: 3 General stair comments: VCs sequencing, pt has L grab bar at home, she refused use of cane in R hand, stated she can hold storm door on R   Wheelchair Mobility     Tilt Bed    Modified Rankin (Stroke Patients Only)       Balance Overall balance assessment: Needs assistance Sitting-balance support: Feet supported Sitting balance-Leahy Scale: Good     Standing balance support: Bilateral upper extremity supported, During functional activity, Reliant on assistive device for balance Standing balance-Leahy Scale: Poor                              Cognition Arousal: Alert Behavior During Therapy: WFL for tasks assessed/performed Overall Cognitive Status: Within Functional Limits for tasks assessed                                          Exercises Total Joint Exercises Ankle Circles/Pumps: AROM, Both, 10 reps, Supine Quad Sets: AROM, Right, 5 reps, Supine Short Arc Quad: AROM, Right, 5 reps, Supine Heel Slides: AAROM, Right, 5 reps, Supine Hip ABduction/ADduction: AROM, Right, 5 reps, Supine Straight Leg Raises: AAROM, Right, 5 reps, Supine Long Arc Quad: AROM, Right, Seated, 10 reps Knee Flexion: AAROM, Right, 10 reps, Supine Goniometric ROM: 5-65* AAROM R knee    General Comments  Pertinent Vitals/Pain Pain Assessment Pain Score: 4  Pain Location: R knee Pain Descriptors / Indicators: Discomfort, Operative site guarding, Sore Pain Intervention(s): Limited activity within patient's tolerance, Monitored during session, Ice applied, Repositioned    Home Living                          Prior Function            PT Goals (current goals can now be found in the care plan section) Acute Rehab PT Goals Patient Stated Goal: to have the R knee be as good as  the L PT Goal Formulation: With patient Time For Goal Achievement: 08/26/23 Potential to Achieve Goals: Good Progress towards PT goals: Progressing toward goals    Frequency    7X/week      PT Plan      Co-evaluation              AM-PAC PT "6 Clicks" Mobility   Outcome Measure  Help needed turning from your back to your side while in a flat bed without using bedrails?: None Help needed moving from lying on your back to sitting on the side of a flat bed without using bedrails?: A Little Help needed moving to and from a bed to a chair (including a wheelchair)?: None Help needed standing up from a chair using your arms (e.g., wheelchair or bedside chair)?: None Help needed to walk in hospital room?: None Help needed climbing 3-5 steps with a railing? : A Little 6 Click Score: 22    End of Session Equipment Utilized During Treatment: Gait belt Activity Tolerance: Patient tolerated treatment well Patient left: with call bell/phone within reach;in bed;with family/visitor present Nurse Communication: Mobility status PT Visit Diagnosis: Unsteadiness on feet (R26.81);Other abnormalities of gait and mobility (R26.89);Muscle weakness (generalized) (M62.81);Difficulty in walking, not elsewhere classified (R26.2);Pain Pain - Right/Left: Right Pain - part of body: Knee     Time: 3086-5784 PT Time Calculation (min) (ACUTE ONLY): 16 min  Charges:     $Therapeutic Exercise: 8-22 mins PT General Charges $$ ACUTE PT VISIT: 1 Visit                     Tamala Ser PT 08/13/2023  Acute Rehabilitation Services  Office [phone]

## 2023-08-15 DIAGNOSIS — M25561 Pain in right knee: Secondary | ICD-10-CM | POA: Diagnosis not present

## 2023-08-15 DIAGNOSIS — M25661 Stiffness of right knee, not elsewhere classified: Secondary | ICD-10-CM | POA: Diagnosis not present

## 2023-08-19 DIAGNOSIS — M25661 Stiffness of right knee, not elsewhere classified: Secondary | ICD-10-CM | POA: Diagnosis not present

## 2023-08-19 DIAGNOSIS — M25561 Pain in right knee: Secondary | ICD-10-CM | POA: Diagnosis not present

## 2023-08-21 DIAGNOSIS — M25661 Stiffness of right knee, not elsewhere classified: Secondary | ICD-10-CM | POA: Diagnosis not present

## 2023-08-21 DIAGNOSIS — M25561 Pain in right knee: Secondary | ICD-10-CM | POA: Diagnosis not present

## 2023-09-19 DIAGNOSIS — Z5189 Encounter for other specified aftercare: Secondary | ICD-10-CM | POA: Diagnosis not present

## 2024-06-22 DIAGNOSIS — E039 Hypothyroidism, unspecified: Secondary | ICD-10-CM | POA: Diagnosis not present

## 2024-06-22 DIAGNOSIS — E7849 Other hyperlipidemia: Secondary | ICD-10-CM | POA: Diagnosis not present

## 2024-06-22 DIAGNOSIS — J069 Acute upper respiratory infection, unspecified: Secondary | ICD-10-CM | POA: Diagnosis not present

## 2024-06-22 DIAGNOSIS — E559 Vitamin D deficiency, unspecified: Secondary | ICD-10-CM | POA: Diagnosis not present

## 2024-06-22 DIAGNOSIS — E782 Mixed hyperlipidemia: Secondary | ICD-10-CM | POA: Diagnosis not present

## 2024-06-22 DIAGNOSIS — R7303 Prediabetes: Secondary | ICD-10-CM | POA: Diagnosis not present

## 2024-06-22 DIAGNOSIS — K219 Gastro-esophageal reflux disease without esophagitis: Secondary | ICD-10-CM | POA: Diagnosis not present

## 2024-06-22 DIAGNOSIS — Z0001 Encounter for general adult medical examination with abnormal findings: Secondary | ICD-10-CM | POA: Diagnosis not present

## 2024-06-26 DIAGNOSIS — Z471 Aftercare following joint replacement surgery: Secondary | ICD-10-CM | POA: Diagnosis not present

## 2024-06-26 DIAGNOSIS — Z96653 Presence of artificial knee joint, bilateral: Secondary | ICD-10-CM | POA: Diagnosis not present
